# Patient Record
Sex: Female | Born: 1990 | Race: White | Hispanic: No | Marital: Single | Smoking: Current every day smoker
Health system: Southern US, Community
[De-identification: ages and names within clinical notes are randomized; demographics above are authoritative.]

## PROBLEM LIST (undated history)

## (undated) DIAGNOSIS — F419 Anxiety disorder, unspecified: Secondary | ICD-10-CM

## (undated) DIAGNOSIS — R011 Cardiac murmur, unspecified: Secondary | ICD-10-CM

## (undated) DIAGNOSIS — I10 Essential (primary) hypertension: Secondary | ICD-10-CM

## (undated) HISTORY — DX: Anxiety disorder, unspecified: F41.9

## (undated) HISTORY — DX: Cardiac murmur, unspecified: R01.1

---
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
## 2010-04-20 ENCOUNTER — Ambulatory Visit: Payer: Self-pay | Admitting: Internal Medicine

## 2010-05-28 ENCOUNTER — Emergency Department (HOSPITAL_COMMUNITY)
Admission: EM | Admit: 2010-05-28 | Discharge: 2010-05-28 | Payer: Self-pay | Source: Home / Self Care | Admitting: Emergency Medicine

## 2010-06-01 LAB — GC/CHLAMYDIA PROBE AMP, GENITAL
Chlamydia, DNA Probe: POSITIVE — AB
GC Probe Amp, Genital: NEGATIVE

## 2010-06-01 LAB — URINE MICROSCOPIC-ADD ON

## 2010-06-01 LAB — WET PREP, GENITAL: Yeast Wet Prep HPF POC: NONE SEEN

## 2010-06-01 LAB — URINALYSIS, ROUTINE W REFLEX MICROSCOPIC
Protein, ur: NEGATIVE mg/dL
Urobilinogen, UA: 0.2 mg/dL (ref 0.0–1.0)
pH: 6.5 (ref 5.0–8.0)

## 2010-06-17 ENCOUNTER — Emergency Department (HOSPITAL_COMMUNITY)
Admission: EM | Admit: 2010-06-17 | Discharge: 2010-06-17 | Disposition: A | Discharge status: Home / Self Care | Payer: No Typology Code available for payment source | Attending: Emergency Medicine | Admitting: Emergency Medicine

## 2010-06-17 DIAGNOSIS — S1093XA Contusion of unspecified part of neck, initial encounter: Secondary | ICD-10-CM | POA: Insufficient documentation

## 2010-06-17 DIAGNOSIS — S0003XA Contusion of scalp, initial encounter: Secondary | ICD-10-CM | POA: Insufficient documentation

## 2010-06-17 DIAGNOSIS — Y929 Unspecified place or not applicable: Secondary | ICD-10-CM | POA: Insufficient documentation

## 2010-06-17 DIAGNOSIS — S0990XA Unspecified injury of head, initial encounter: Secondary | ICD-10-CM | POA: Insufficient documentation

## 2010-06-17 DIAGNOSIS — F341 Dysthymic disorder: Secondary | ICD-10-CM | POA: Insufficient documentation

## 2010-06-17 DIAGNOSIS — I10 Essential (primary) hypertension: Secondary | ICD-10-CM | POA: Insufficient documentation

## 2010-06-17 DIAGNOSIS — Z79899 Other long term (current) drug therapy: Secondary | ICD-10-CM | POA: Insufficient documentation

## 2010-06-17 DIAGNOSIS — R51 Headache: Secondary | ICD-10-CM | POA: Insufficient documentation

## 2010-08-13 LAB — CBC
HCT: 36.1 % (ref 36.0–46.0)
MCHC: 35.5 g/dL (ref 30.0–36.0)
MCV: 87.6 fL (ref 78.0–100.0)

## 2010-08-13 LAB — URINE MICROSCOPIC-ADD ON

## 2010-08-13 LAB — DIFFERENTIAL
Basophils Relative: 0 % (ref 0–1)
Eosinophils Relative: 1 % (ref 0–5)
Monocytes Absolute: 0.7 10*3/uL (ref 0.1–1.0)
Monocytes Relative: 7 % (ref 3–12)

## 2010-08-13 LAB — COMPREHENSIVE METABOLIC PANEL
ALT: 12 U/L (ref 0–35)
Albumin: 4.3 g/dL (ref 3.5–5.2)
CO2: 23 mEq/L (ref 19–32)
Calcium: 8.9 mg/dL (ref 8.4–10.5)
Creatinine, Ser: 0.48 mg/dL (ref 0.4–1.2)
Glucose, Bld: 101 mg/dL — ABNORMAL HIGH (ref 70–99)
Sodium: 138 mEq/L (ref 135–145)
Total Protein: 7.3 g/dL (ref 6.0–8.3)

## 2010-08-13 LAB — URINALYSIS, ROUTINE W REFLEX MICROSCOPIC
Glucose, UA: NEGATIVE mg/dL
Leukocytes, UA: NEGATIVE
Nitrite: NEGATIVE
Specific Gravity, Urine: >1.03 — ABNORMAL HIGH (ref 1.005–1.030)

## 2010-08-13 LAB — PREGNANCY, URINE: Preg Test, Ur: NEGATIVE

## 2010-08-24 LAB — BASIC METABOLIC PANEL
CO2: 23 mEq/L (ref 19–32)
Calcium: 9.6 mg/dL (ref 8.4–10.5)
Chloride: 106 mEq/L (ref 96–112)
Creatinine, Ser: 0.51 mg/dL (ref 0.4–1.2)
Glucose, Bld: 101 mg/dL — ABNORMAL HIGH (ref 70–99)
Sodium: 138 mEq/L (ref 135–145)

## 2010-08-24 LAB — DIFFERENTIAL
Basophils Absolute: 0.1 10*3/uL (ref 0.0–0.1)
Basophils Relative: 1 % (ref 0–1)
Eosinophils Absolute: 0.2 10*3/uL (ref 0.0–1.2)
Eosinophils Relative: 2 % (ref 0–5)
Monocytes Absolute: 0.6 10*3/uL (ref 0.2–1.2)
Neutro Abs: 4.8 10*3/uL (ref 1.7–8.0)

## 2010-08-24 LAB — CBC
Hemoglobin: 14.3 g/dL (ref 12.0–16.0)
MCHC: 33.9 g/dL (ref 31.0–37.0)
MCV: 87.8 fL (ref 78.0–98.0)
RDW: 12.7 % (ref 11.4–15.5)

## 2010-12-02 ENCOUNTER — Other Ambulatory Visit (HOSPITAL_COMMUNITY): Payer: Self-pay | Admitting: "Endocrinology

## 2010-12-02 DIAGNOSIS — E0590 Thyrotoxicosis, unspecified without thyrotoxic crisis or storm: Secondary | ICD-10-CM

## 2010-12-03 ENCOUNTER — Encounter (HOSPITAL_COMMUNITY)
Admission: RE | Admit: 2010-12-03 | Discharge: 2010-12-03 | Disposition: A | Discharge status: Home / Self Care | Payer: BC Managed Care – PPO | Source: Ambulatory Visit | Attending: "Endocrinology | Admitting: "Endocrinology

## 2010-12-03 ENCOUNTER — Encounter (HOSPITAL_COMMUNITY): Payer: Self-pay

## 2010-12-03 DIAGNOSIS — E0590 Thyrotoxicosis, unspecified without thyrotoxic crisis or storm: Secondary | ICD-10-CM

## 2010-12-03 HISTORY — DX: Essential (primary) hypertension: I10

## 2010-12-03 MED ORDER — SODIUM IODIDE I 131 CAPSULE
10.0000 | Freq: Once | INTRAVENOUS | Status: AC | PRN
  Administered 2010-12-03: 12 via ORAL

## 2010-12-04 ENCOUNTER — Encounter (HOSPITAL_COMMUNITY)
Admission: RE | Admit: 2010-12-04 | Discharge: 2010-12-04 | Disposition: A | Discharge status: Home / Self Care | Payer: BC Managed Care – PPO | Source: Ambulatory Visit | Attending: "Endocrinology | Admitting: "Endocrinology

## 2010-12-04 DIAGNOSIS — E0590 Thyrotoxicosis, unspecified without thyrotoxic crisis or storm: Secondary | ICD-10-CM | POA: Insufficient documentation

## 2010-12-04 MED ORDER — SODIUM PERTECHNETATE TC 99M INJECTION
10.0000 | Freq: Once | INTRAVENOUS | Status: AC | PRN
  Administered 2010-12-04: 10.4 via INTRAVENOUS

## 2013-08-30 ENCOUNTER — Other Ambulatory Visit (HOSPITAL_COMMUNITY)
Admission: RE | Admit: 2013-08-30 | Discharge: 2013-08-30 | Disposition: A | Discharge status: Home / Self Care | Payer: BC Managed Care – PPO | Source: Ambulatory Visit | Attending: Obstetrics and Gynecology | Admitting: Obstetrics and Gynecology

## 2013-08-30 ENCOUNTER — Ambulatory Visit (INDEPENDENT_AMBULATORY_CARE_PROVIDER_SITE_OTHER): Payer: BC Managed Care – PPO | Admitting: Advanced Practice Midwife

## 2013-08-30 ENCOUNTER — Encounter: Payer: Self-pay | Admitting: Advanced Practice Midwife

## 2013-08-30 VITALS — BP 120/84 | Ht 64.5 in | Wt 132.0 lb

## 2013-08-30 DIAGNOSIS — F17200 Nicotine dependence, unspecified, uncomplicated: Secondary | ICD-10-CM

## 2013-08-30 DIAGNOSIS — Z01419 Encounter for gynecological examination (general) (routine) without abnormal findings: Secondary | ICD-10-CM

## 2013-08-30 NOTE — Progress Notes (Signed)
Lorraine MayotteKristen E Allen 22 y.o.  Filed Vitals:   08/30/13 1415  BP: 120/84     Past Medical History: Past Medical History  Diagnosis Date  . Hypertension   . Mitral click-murmur syndrome   . Anxiety     Past Surgical History: History reviewed. No pertinent past surgical history.  Family History: Family History  Problem Relation Age of Onset  . Hypertension Father   . Stroke Father   . Hypertension Maternal Grandfather   . Diabetes Paternal Grandmother   . Thyroid disease Paternal Grandmother   . Stroke Paternal Grandfather   . Cancer Other     maternal great grandma  . Stroke Other     paternal great grandpa    Social History: History  Substance Use Topics  . Smoking status: Current Every Day Smoker -- 1.00 packs/day    Types: Cigarettes  . Smokeless tobacco: Never Used  . Alcohol Use: Yes     Comment: occ    Allergies: No Known Allergies   History of Present Illness:  Here for well woman exam.  Still smoking ~ 1ppd cigarettes.  Smoking cessation: cold [place], patches, gum, chantix, vaping discussed.  Pt will think about it.   Current outpatient prescriptions:ALPRAZolam (XANAX) 0.5 MG tablet, , Disp: , Rfl: ;  atenolol (TENORMIN) 25 MG tablet, Take by mouth daily., Disp: , Rfl:     Review of Systems   Patient denies any headaches, blurred vision, shortness of breath, chest pain, abdominal pain, problems with bowel movements, urination, or intercourse.   Physical Exam: General:  Well developed, well nourished, no acute distress Skin:  Warm and dry Neck:  Midline trachea, normal thyroid Lungs; Clear to auscultation bilaterally Breast:  No dominant palpable mass, retraction, or nipple discharge Cardiovascular: Regular rate and rhythm Abdomen:  Soft, non tender, no hepatosplenomegaly Pelvic:  External genitalia is normal in appearance.  The vagina is normal in appearance.  The cervix is bulbous.  Uterus is felt to be normal size, shape, and contour.  No adnexal  masses or tenderness noted. Extremities:  No swelling or varicosities noted Psych:  No mood changes.  Takes xanax prn long car rides.    Impression: normal GYN exam smoker  Plan: pap q 2-3 years May try e cig Guardisil

## 2013-09-05 ENCOUNTER — Encounter: Payer: Self-pay | Admitting: Advanced Practice Midwife

## 2013-09-17 ENCOUNTER — Other Ambulatory Visit: Payer: BC Managed Care – PPO

## 2013-09-17 DIAGNOSIS — Z113 Encounter for screening for infections with a predominantly sexual mode of transmission: Secondary | ICD-10-CM

## 2013-09-18 LAB — GC/CHLAMYDIA PROBE AMP
CT PROBE, AMP APTIMA: NEGATIVE
GC Probe RNA: NEGATIVE

## 2013-09-19 ENCOUNTER — Encounter: Payer: Self-pay | Admitting: Advanced Practice Midwife

## 2014-02-02 ENCOUNTER — Emergency Department (INDEPENDENT_AMBULATORY_CARE_PROVIDER_SITE_OTHER)
Admission: EM | Admit: 2014-02-02 | Discharge: 2014-02-02 | Disposition: A | Discharge status: Home / Self Care | Payer: BC Managed Care – PPO | Source: Home / Self Care | Attending: Emergency Medicine | Admitting: Emergency Medicine

## 2014-02-02 ENCOUNTER — Encounter (HOSPITAL_COMMUNITY): Payer: Self-pay | Admitting: Emergency Medicine

## 2014-02-02 DIAGNOSIS — M436 Torticollis: Secondary | ICD-10-CM

## 2014-02-02 MED ORDER — CYCLOBENZAPRINE HCL 5 MG PO TABS: 5.0000 mg | ORAL_TABLET | Freq: Three times a day (TID) | ORAL | Status: DC | PRN

## 2014-02-02 MED ORDER — HYDROCODONE-ACETAMINOPHEN 5-325 MG PO TABS: ORAL_TABLET | ORAL | Status: DC

## 2014-02-02 MED ORDER — DICLOFENAC SODIUM 75 MG PO TBEC: 75.0000 mg | DELAYED_RELEASE_TABLET | Freq: Two times a day (BID) | ORAL | Status: DC

## 2014-02-02 NOTE — ED Provider Notes (Signed)
Chief Complaint   Torticollis   History of Present Illness   Lorraine Allen is a 23 year old female who awoke 3 days ago with left-sided neck pain without radiation. This extended from the base of the skull into the trapezius ridge and towards the shoulder. It was worse with any movement of the neck especially lateral bending or twisting to either side. She is able to flex and extend pretty well. There is no radiation the pain down the arm, numbness, tingling, or weakness. No swelling of the arm. She denies any fever, chills, or headache. There no masses in the neck. She denies chest pain or shortness of breath. There's been no injury to her neck.  Review of Systems   Other than noted above, the patient denies any of the following symptoms: Constitutional:  No fever or chills. Neck:  No swelling, or adenopathy.   Cardiac:  No chest pain, tightness, or pressure. Respiratory:  No cough or dyspnea. M-S:  No joint pain, muscle pain, or back problems. Neuro:  No headache, muscle weakness, or paresthesias.  PMFSH   Past medical history, family history, social history, meds, and allergies were reviewed.  She takes [employer]. She has a history of mitral prolapse and hypertension.  Physical Examination    Vital signs:  BP 129/86  Pulse 87  Temp(Src) 98.1 F (36.7 C) (Oral)  Resp 16  SpO2 100% General:  Alert, oriented and in no distress. ENT:  Pharynx clear, no oral lesions. Neck:  There is pain to palpation over the left trapezius ridge, her neck has a limited range of motionwith lateral bending and rotation to either side. She can flex well, but there is some limitation of extension.  Spurling's test was negative. Lungs:  No respiratory distress.  Breath sounds clear and equal bilaterally.  No wheezes, rales or rhonchi. Heart:  Regular rhythm.  No gallops, murmers, or rubs. Ext:  No upper extremity edema, pulses full.  Full ROM of joints with no joint or muscle pain to  palpation. Neuro:  Alert and oriented times 3.  No focal muscle weakness.  DTRs symmetric.  Sensation intact to light touch. Skin: Clear, warm and dry.  No rash.  Good capillary refill.  Assessment   The encounter diagnosis was Torticollis.  No evidence of cervical radiculopathy.  Plan    1.  Meds:  The following meds were prescribed:   Discharge Medication List as of 02/02/2014  4:18 PM    START taking these medications   Details  cyclobenzaprine (FLEXERIL) 5 MG tablet Take 1 tablet (5 mg total) by mouth 3 (three) times daily as needed for muscle spasms., Starting 02/02/2014, Until Discontinued, Normal    diclofenac (VOLTAREN) 75 MG EC tablet Take 1 tablet (75 mg total) by mouth 2 (two) times daily., Starting 02/02/2014, Until Discontinued, Normal    HYDROcodone-acetaminophen (NORCO/VICODIN) 5-325 MG per tablet 1 to 2 tabs every 4 to 6 hours as needed for pain., Print        2.  Patient Education/Counseling:  The patient was given appropriate handouts, self care instructions, and instructed in pain control.  Given exercises to do twice daily followed by moist heat.    3.  Follow up:  The patient was told to follow up here if no better in 3 to 4 days, or sooner if becoming worse in any way, and given some red flag symptoms such as worsening pain or new neurological symptoms which would prompt immediate return.  Reuben Likes, MD 02/02/14 [phone]

## 2014-02-02 NOTE — ED Notes (Signed)
Patient woke up a couple days ago with a stiff neck Denies any injury

## 2014-02-02 NOTE — Discharge Instructions (Signed)
TREATMENT  °Treatment initially involves the use of ice and medication to help reduce pain and inflammation. It is also important to perform strengthening and stretching exercises and modify activities that worsen symptoms so the injury does not get worse. These exercises may be performed at home or with a therapist. For patients who experience severe symptoms, a soft padded collar may be recommended to be worn around the neck.  °Improving your posture may help reduce symptoms. Posture improvement includes pulling your chin and abdomen in while sitting or standing. If you are sitting, sit in a firm chair with your buttocks against the back of the chair. While sleeping, try replacing your pillow with a small towel rolled to 2 inches in diameter, or use a cervical pillow. Poor sleeping positions delay healing.  ° °MEDICATION  °· If pain medication is necessary, nonsteroidal anti-inflammatory medications, such as aspirin and ibuprofen, or other minor pain relievers, such as acetaminophen, are often recommended. °· Do not take pain medication for 7 days before surgery. °· Prescription pain relievers may be given if deemed necessary by your caregiver. Use only as directed and only as much as you need. ° °HEAT AND COLD:  °· Cold treatment (icing) relieves pain and reduces inflammation. Cold treatment should be applied for 10 to 15 minutes every 2 to 3 hours for inflammation and pain and immediately after any activity that aggravates your symptoms. Use ice packs or an ice massage. °· Heat treatment may be used prior to performing the stretching and strengthening activities prescribed by your caregiver, physical therapist, or athletic trainer. Use a heat pack or a warm soak. ° °SEEK MEDICAL CARE IF:  °· Symptoms get worse or do not improve in 2 weeks despite treatment. °· New, unexplained symptoms develop (drugs used in treatment may produce side effects). ° °EXERCISES °RANGE OF MOTION (ROM) AND STRETCHING EXERCISES -  Cervical Strain and Sprain °These exercises may help you when beginning to rehabilitate your injury. In order to successfully resolve your symptoms, you must improve your posture. These exercises are designed to help reduce the forward-head and rounded-shoulder posture which contributes to this condition. Your symptoms may resolve with or without further involvement from your physician, physical therapist or athletic trainer. While completing these exercises, remember:  °· Restoring tissue flexibility helps normal motion to return to the joints. This allows healthier, less painful movement and activity. °· An effective stretch should be held for at least 20 seconds, although you may need to begin with shorter hold times for comfort. °· A stretch should never be painful. You should only feel a gentle lengthening or release in the stretched tissue. ° °STRETCH- Axial Extensors °· Lie on your back on the floor. You may bend your knees for comfort. Place a rolled up hand towel or dish towel, about 2 inches in diameter, under the part of your head that makes contact with the floor. °· Gently tuck your chin, as if trying to make a "double chin," until you feel a gentle stretch at the base of your head. °· Hold _____10_____ seconds. °Repeat _____10_____ times. Complete this exercise _____2_____ times per day.  ° °STRETECH - Axial Extension  °· Stand or sit on a firm surface. Assume a good posture: chest up, shoulders drawn back, abdominal muscles slightly tense, knees unlocked (if standing) and feet hip width apart. °· Slowly retract your chin so your head slides back and your chin slightly lowers.Continue to look straight ahead. °· You should feel a gentle stretch   in the back of your head. Be certain not to feel an aggressive stretch since this can cause headaches later. °· Hold for ____10______ seconds. °Repeat _____10_____ times. Complete this exercise ____2______ times per day. ° °STRETCH  Cervical Side Bend  °· Stand  or sit on a firm surface. Assume a good posture: chest up, shoulders drawn back, abdominal muscles slightly tense, knees unlocked (if standing) and feet hip width apart. °· Without letting your nose or shoulders move, slowly tip your right / left ear to your shoulder until your feel a gentle stretch in the muscles on the opposite side of your neck. °· Hold _____10_____ seconds. °Repeat _____10_____ times. Complete this exercise _____2_____ times per day. ° °STRETCH  Cervical Rotators  °· Stand or sit on a firm surface. Assume a good posture: chest up, shoulders drawn back, abdominal muscles slightly tense, knees unlocked (if standing) and feet hip width apart. °· Keeping your eyes level with the ground, slowly turn your head until you feel a gentle stretch along the back and opposite side of your neck. °· Hold _____10_____ seconds. °Repeat ____10______ times. Complete this exercise ____2______ times per day. ° °RANGE OF MOTION - Neck Circles  °· Stand or sit on a firm surface. Assume a good posture: chest up, shoulders drawn back, abdominal muscles slightly tense, knees unlocked (if standing) and feet hip width apart. °· Gently roll your head down and around from the back of one shoulder to the back of the other. The motion should never be forced or painful. °· Repeat the motion 10-20 times, or until you feel the neck muscles relax and loosen. °Repeat ____10______ times. Complete the exercise _____2_____ times per day. ° °STRENGTHENING EXERCISES - Cervical Strain and Sprain °These exercises may help you when beginning to rehabilitate your injury. They may resolve your symptoms with or without further involvement from your physician, physical therapist or athletic trainer. While completing these exercises, remember:  °· Muscles can gain both the endurance and the strength needed for everyday activities through controlled exercises. °· Complete these exercises as instructed by your physician, physical therapist or  athletic trainer. Progress the resistance and repetitions only as guided. °· You may experience muscle soreness or fatigue, but the pain or discomfort you are trying to eliminate should never worsen during these exercises. If this pain does worsen, stop and make certain you are following the directions exactly. If the pain is still present after adjustments, discontinue the exercise until you can discuss the trouble with your clinician. ° °STRENGTH Cervical Flexors, Isometric °· Face a wall, standing about 6 inches away. Place a small pillow, a ball about 6-8 inches in diameter, or a folded towel between your forehead and the wall. °· Slightly tuck your chin and gently push your forehead into the soft object. Push only with mild to moderate intensity, building up tension gradually. Keep your jaw and forehead relaxed. °· Hold 10 to 20 seconds. Keep your breathing relaxed. °· Release the tension slowly. Relax your neck muscles completely before you start the next repetition. °Repeat _____10_____ times. Complete this exercise _____2_____ times per day. ° °STRENGTH- Cervical Lateral Flexors, Isometric  °· Stand about 6 inches away from a wall. Place a small pillow, a ball about 6-8 inches in diameter, or a folded towel between the side of your head and the wall. °· Slightly tuck your chin and gently tilt your head into the soft object. Push only with mild to moderate intensity, building up tension gradually. Keep   your jaw and forehead relaxed. °· Hold 10 to 20 seconds. Keep your breathing relaxed. °· Release the tension slowly. Relax your neck muscles completely before you start the next repetition. °Repeat _____10_____ times. Complete this exercise ____2______ times per day. ° °STRENGTH  Cervical Extensors, Isometric  °· Stand about 6 inches away from a wall. Place a small pillow, a ball about 6-8 inches in diameter, or a folded towel between the back of your head and the wall. °· Slightly tuck your chin and gently  tilt your head back into the soft object. Push only with mild to moderate intensity, building up tension gradually. Keep your jaw and forehead relaxed. °· Hold 10 to 20 seconds. Keep your breathing relaxed. °· Release the tension slowly. Relax your neck muscles completely before you start the next repetition. °Repeat _____10_____ times. Complete this exercise _____2_____ times per day. ° °POSTURE AND BODY MECHANICS CONSIDERATIONS - Cervical Strain and Sprain °Keeping correct posture when sitting, standing or completing your activities will reduce the stress put on different body tissues, allowing injured tissues a chance to heal and limiting painful experiences. The following are general guidelines for improved posture. Your physician or physical therapist will provide you with any instructions specific to your needs. While reading these guidelines, remember: °· The exercises prescribed by your provider will help you have the flexibility and strength to maintain correct postures. °· The correct posture provides the optimal environment for your joints to work. All of your joints have less wear and tear when properly supported by a spine with good posture. This means you will experience a healthier, less painful body. °· Correct posture must be practiced with all of your activities, especially prolonged sitting and standing. Correct posture is as important when doing repetitive low-stress activities (typing) as it is when doing a single heavy-load activity (lifting). °PROLONGED STANDING WHILE SLIGHTLY LEANING FORWARD °When completing a task that requires you to lean forward while standing in one place for a long time, place either foot up on a stationary 2-4 inch high object to help maintain the best posture. When both feet are on the ground, the low back tends to lose its slight inward curve. If this curve flattens (or becomes too large), then the back and your other joints will experience too much stress, fatigue  more quickly and can cause pain.  °RESTING POSITIONS °Consider which positions are most painful for you when choosing a resting position. If you have pain with flexion-based activities (sitting, bending, stooping, squatting), choose a position that allows you to rest in a less flexed posture. You would want to avoid curling into a fetal position on your side. If your pain worsens with extension-based activities (prolonged standing, working overhead), avoid resting in an extended position such as sleeping on your stomach. Most people will find more comfort when they rest with their spine in a more neutral position, neither too rounded nor too arched. Lying on a non-sagging bed on your side with a pillow between your knees, or on your back with a pillow under your knees will often provide some relief. Keep in mind, being in any one position for a prolonged period of time, no matter how correct your posture, can still lead to stiffness. °WALKING °Walk with an upright posture. Your ears, shoulders and hips should all line-up. °OFFICE WORK °When working at a desk, create an environment that supports good, upright posture. Without extra support, muscles fatigue and lead to excessive strain on joints and other tissues. °  CHAIR: °· A chair should be able to slide under your desk when your back makes contact with the back of the chair. This allows you to work closely. °· The chair's height should allow your eyes to be level with the upper part of your monitor and your hands to be slightly lower than your elbows. °· Body position: °· Your feet should make contact with the floor. If this is not possible, use a foot rest. °· Keep your ears over your shoulders. This will reduce stress on your neck and low back. °Document Released: 04/26/2005 Document Revised: 07/19/2011 Document Reviewed: 08/08/2008 °[employer]® Patient Information ©2013 [employer]. ° ° °

## 2014-03-04 ENCOUNTER — Telehealth: Payer: Self-pay | Admitting: *Deleted

## 2014-03-04 NOTE — Telephone Encounter (Signed)
Pt states she has been bleeding X 2 months, started as a regular period but has not stopped.  Bleeding will lighten up some then get heavy again, she states she is not on any birth control and is concerned as to why she has been bleeding for so long.  Advised pt need to be seen to assess, pt agreed and call was transferred to front staff for appointment to be made.

## 2014-03-06 ENCOUNTER — Ambulatory Visit (INDEPENDENT_AMBULATORY_CARE_PROVIDER_SITE_OTHER): Payer: BC Managed Care – PPO | Admitting: Advanced Practice Midwife

## 2014-03-06 ENCOUNTER — Encounter: Payer: Self-pay | Admitting: Advanced Practice Midwife

## 2014-03-06 ENCOUNTER — Ambulatory Visit: Payer: BC Managed Care – PPO | Admitting: Advanced Practice Midwife

## 2014-03-06 VITALS — BP 134/88 | Ht 65.0 in | Wt 127.0 lb

## 2014-03-06 DIAGNOSIS — Z113 Encounter for screening for infections with a predominantly sexual mode of transmission: Secondary | ICD-10-CM

## 2014-03-06 DIAGNOSIS — N938 Other specified abnormal uterine and vaginal bleeding: Secondary | ICD-10-CM

## 2014-03-06 DIAGNOSIS — Z3202 Encounter for pregnancy test, result negative: Secondary | ICD-10-CM

## 2014-03-06 LAB — POCT URINE PREGNANCY: PREG TEST UR: NEGATIVE

## 2014-03-06 MED ORDER — MEGESTROL ACETATE 40 MG PO TABS
ORAL_TABLET | ORAL | Status: DC
Start: 2014-03-06 — End: 2014-07-24

## 2014-03-06 NOTE — Progress Notes (Signed)
[facility] Visit  Patient name: Lorraine Allen Stafford MRN [id number]  Date of birth: 1991-04-29  CC & HPI:  Lorraine Allen Akram is a 23 y.o. Caucasian female presenting today for intramenstrual bleeding.  She had a normal period 9/1 for about a week, spotted q day since with sometimes heavier.  Feels like she is now on her regular period. Wants Nexplanon  Pertinent History Reviewed:  Medical & Surgical Hx:   Past Medical History  Diagnosis Date  . Hypertension   . Mitral click-murmur syndrome   . Anxiety    History reviewed. No pertinent past surgical history. Medications: Reviewed & Updated - see associated section Social History: Reviewed -  reports that she has been smoking Cigarettes.  She has been smoking about 0.50 packs per day. She has never used smokeless tobacco.  Objective Findings:  Vitals: BP 134/88  Ht 5\' 5"  (1.651 m)  Wt 127 lb (57.607 kg)  BMI 21.13 kg/m2  LMP 01/08/2014  Physical Examination: General appearance - alert, well appearing, and in no distress Mental status - alert, oriented to person, place, and time Pelvic - SSE;  Had tampon in, dark menstrual blood coming from cx os.  Cx non friable Wet prep negative  Results for orders placed in visit on 03/06/14 (from the past 24 hour(s))  POCT URINE PREGNANCY   Collection Time    03/06/14  1:36 PM      Result Value Ref Range   Preg Test, Ur Negative      HGB 13.0  Assessment & Plan:  A:   DUB, ? origin P:  Plans  Nexplanon-no sex until then.  Megace algorhythm, GC/CHL  F/U 3 weeks CRESENZO-DISHMAN,Tippi Mccrae CNM 03/06/2014 2:11 PM

## 2014-03-07 LAB — GC/CHLAMYDIA PROBE AMP
CT PROBE, AMP APTIMA: NEGATIVE
GC Probe RNA: NEGATIVE

## 2014-03-27 ENCOUNTER — Ambulatory Visit (INDEPENDENT_AMBULATORY_CARE_PROVIDER_SITE_OTHER): Payer: BC Managed Care – PPO | Admitting: Advanced Practice Midwife

## 2014-03-27 ENCOUNTER — Encounter: Payer: Self-pay | Admitting: Advanced Practice Midwife

## 2014-03-27 VITALS — BP 128/82 | Ht 65.0 in | Wt 130.0 lb

## 2014-03-27 DIAGNOSIS — Z3202 Encounter for pregnancy test, result negative: Secondary | ICD-10-CM

## 2014-03-27 DIAGNOSIS — Z30017 Encounter for initial prescription of implantable subdermal contraceptive: Secondary | ICD-10-CM | POA: Insufficient documentation

## 2014-03-27 DIAGNOSIS — Z3049 Encounter for surveillance of other contraceptives: Secondary | ICD-10-CM

## 2014-03-27 LAB — POCT URINE PREGNANCY: Preg Test, Ur: NEGATIVE

## 2014-03-27 MED ORDER — AZITHROMYCIN 250 MG PO TABS: 250.0000 mg | ORAL_TABLET | Freq: Every day | ORAL | Status: DC

## 2014-03-27 NOTE — Progress Notes (Signed)
  HPI:  Lorraine Allen is a 23 y.o. year old Caucasian female here for Nexplanon insertion.  Her LMP was 03/06/14, she has been abstinate this month and on megace for DUB, and her pregnancy test today was negative.  Risks/benefits/side effects of Nexplanon have been discussed and her questions have been answered.  Specifically, a failure rate of 05/998 has been reported, with an increased failure rate if pt takes St. John's Wort and/or antiseizure medicaitons.  Lorraine Allen is aware of the common side effect of irregular bleeding, which the incidence of decreases over time.   Past Medical History: Past Medical History  Diagnosis Date  . Hypertension   . Mitral click-murmur syndrome   . Anxiety     Past Surgical History: History reviewed. No pertinent past surgical history.  Family History: Family History  Problem Relation Age of Onset  . Hypertension Father   . Stroke Father   . Hypertension Maternal Grandfather   . Diabetes Paternal Grandmother   . Thyroid disease Paternal Grandmother   . Stroke Paternal Grandfather   . Cancer Other     maternal great grandma  . Stroke Other     paternal great grandpa    Social History: History  Substance Use Topics  . Smoking status: Current Every Day Smoker -- 0.50 packs/day    Types: Cigarettes  . Smokeless tobacco: Never Used  . Alcohol Use: Yes     Comment: occx1 mos    Allergies:  Allergies  Allergen Reactions  . Strawberry Swelling      Her right arm, approximatly 4 inches proximal from the elbow, was cleansed with alcohol and anesthetized with 2cc of 2% Lidocaine.  The area was cleansed again and the Nexplanon was inserted without difficulty.  A pressure bandage was applied.  Pt was instructed to remove pressure bandage in a few hours, and keep insertion site covered with a bandaid for 3 days.  Back up contraception was recommended for 3 weeks.  Follow-up scheduled PRN problems  zpack sent to pharmacy for URI sx > 1  week  CRESENZO-DISHMAN,Darryll Raju 03/27/2014 10:10 AM

## 2014-05-23 ENCOUNTER — Emergency Department (HOSPITAL_COMMUNITY): Payer: BLUE CROSS/BLUE SHIELD

## 2014-05-23 ENCOUNTER — Emergency Department (HOSPITAL_COMMUNITY)
Admission: EM | Admit: 2014-05-23 | Discharge: 2014-05-23 | Disposition: A | Discharge status: Home / Self Care | Payer: BLUE CROSS/BLUE SHIELD | Attending: Emergency Medicine | Admitting: Emergency Medicine

## 2014-05-23 ENCOUNTER — Encounter (HOSPITAL_COMMUNITY): Payer: Self-pay | Admitting: Emergency Medicine

## 2014-05-23 DIAGNOSIS — R200 Anesthesia of skin: Secondary | ICD-10-CM | POA: Insufficient documentation

## 2014-05-23 DIAGNOSIS — R51 Headache: Secondary | ICD-10-CM | POA: Diagnosis not present

## 2014-05-23 DIAGNOSIS — I10 Essential (primary) hypertension: Secondary | ICD-10-CM | POA: Diagnosis not present

## 2014-05-23 DIAGNOSIS — Z720 Tobacco use: Secondary | ICD-10-CM | POA: Insufficient documentation

## 2014-05-23 DIAGNOSIS — Z793 Long term (current) use of hormonal contraceptives: Secondary | ICD-10-CM | POA: Insufficient documentation

## 2014-05-23 DIAGNOSIS — F419 Anxiety disorder, unspecified: Secondary | ICD-10-CM | POA: Diagnosis not present

## 2014-05-23 DIAGNOSIS — Z79899 Other long term (current) drug therapy: Secondary | ICD-10-CM | POA: Insufficient documentation

## 2014-05-23 DIAGNOSIS — Z7952 Long term (current) use of systemic steroids: Secondary | ICD-10-CM | POA: Insufficient documentation

## 2014-05-23 LAB — CBC WITH DIFFERENTIAL/PLATELET
Basophils Absolute: 0 10*3/uL (ref 0.0–0.1)
Basophils Relative: 0 % (ref 0–1)
EOS ABS: 0.2 10*3/uL (ref 0.0–0.7)
Eosinophils Relative: 2 % (ref 0–5)
HCT: 45 % (ref 36.0–46.0)
HEMOGLOBIN: 15 g/dL (ref 12.0–15.0)
LYMPHS ABS: 2 10*3/uL (ref 0.7–4.0)
LYMPHS PCT: 16 % (ref 12–46)
MCH: 31.6 pg (ref 26.0–34.0)
MCHC: 33.3 g/dL (ref 30.0–36.0)
MCV: 94.9 fL (ref 78.0–100.0)
Monocytes Absolute: 1 10*3/uL (ref 0.1–1.0)
Monocytes Relative: 8 % (ref 3–12)
Neutro Abs: 9 10*3/uL — ABNORMAL HIGH (ref 1.7–7.7)
Neutrophils Relative %: 74 % (ref 43–77)
PLATELETS: 309 10*3/uL (ref 150–400)
RBC: 4.74 MIL/uL (ref 3.87–5.11)
RDW: 12.5 % (ref 11.5–15.5)
WBC: 12.2 10*3/uL — ABNORMAL HIGH (ref 4.0–10.5)

## 2014-05-23 LAB — BASIC METABOLIC PANEL
Anion gap: 7 (ref 5–15)
BUN: 13 mg/dL (ref 6–23)
CALCIUM: 9.7 mg/dL (ref 8.4–10.5)
CO2: 27 mmol/L (ref 19–32)
Chloride: 104 mEq/L (ref 96–112)
Creatinine, Ser: 0.57 mg/dL (ref 0.50–1.10)
GFR calc non Af Amer: >90 mL/min (ref >90)
Glucose, Bld: 127 mg/dL — ABNORMAL HIGH (ref 70–99)
POTASSIUM: 3.2 mmol/L — AB (ref 3.5–5.1)
Sodium: 138 mmol/L (ref 135–145)

## 2014-05-23 MED ORDER — PREDNISONE 50 MG PO TABS
60.0000 mg | ORAL_TABLET | Freq: Once | ORAL | Status: AC
  Administered 2014-05-23: 60 mg via ORAL
  Filled 2014-05-23 (×2): qty 1

## 2014-05-23 MED ORDER — ACYCLOVIR 400 MG PO TABS: 400.0000 mg | ORAL_TABLET | Freq: Every day | ORAL | Status: DC

## 2014-05-23 MED ORDER — ACYCLOVIR 800 MG PO TABS
800.0000 mg | ORAL_TABLET | Freq: Once | ORAL | Status: AC
  Administered 2014-05-23: 800 mg via ORAL
  Filled 2014-05-23: qty 1

## 2014-05-23 MED ORDER — METOCLOPRAMIDE HCL 5 MG/ML IJ SOLN
10.0000 mg | Freq: Once | INTRAMUSCULAR | Status: AC
  Administered 2014-05-23: 10 mg via INTRAVENOUS
  Filled 2014-05-23: qty 2

## 2014-05-23 MED ORDER — PREDNISONE 20 MG PO TABS: ORAL_TABLET | ORAL | Status: DC

## 2014-05-23 MED ORDER — KETOROLAC TROMETHAMINE 30 MG/ML IJ SOLN
30.0000 mg | Freq: Once | INTRAMUSCULAR | Status: AC
  Administered 2014-05-23: 30 mg via INTRAVENOUS
  Filled 2014-05-23: qty 1

## 2014-05-23 MED ORDER — DIPHENHYDRAMINE HCL 50 MG/ML IJ SOLN
25.0000 mg | Freq: Once | INTRAMUSCULAR | Status: AC
  Administered 2014-05-23: 25 mg via INTRAVENOUS
  Filled 2014-05-23: qty 1

## 2014-05-23 NOTE — ED Provider Notes (Signed)
CSN: [id number]     Arrival date & time 05/23/14  1651 History  This chart was scribed for Lorraine Allen, * by Richarda Overlie, ED Scribe. This patient was seen in room APA11/APA11 and the patient's care was started 5:08 PM.    Chief Complaint  Patient presents with  . Numbness   The history is provided by the patient. No language interpreter was used.   HPI Comments: Lorraine Allen is a 24 y.o. female with a history of HTN, mitral click-murmur syndrome and anxiety who presents to the Emergency Department complaining of left sided facial numbness that started this morning when she woke up. Pt states she can feel a difference in sensation from the left side of her forehead down to her mouth in comparison to the right side of her face. Pt reports associated HA and rates her pain as a 2/10 at this time. She states she checked her blood pressure earlier today and her systolic pressure was 180. She states she topped taking her blood pressure medication 1 month ago and has been exercising more recently. She denies any eye irritation, burning or watering.    Past Medical History  Diagnosis Date  . Hypertension   . Mitral click-murmur syndrome   . Anxiety    History reviewed. No pertinent past surgical history. Family History  Problem Relation Age of Onset  . Hypertension Father   . Stroke Father   . Hypertension Maternal Grandfather   . Diabetes Paternal Grandmother   . Thyroid disease Paternal Grandmother   . Stroke Paternal Grandfather   . Cancer Other     maternal great grandma  . Stroke Other     paternal great grandpa   History  Substance Use Topics  . Smoking status: Current Every Day Smoker -- 0.50 packs/day    Types: Cigarettes  . Smokeless tobacco: Never Used  . Alcohol Use: Yes     Comment: occx1 mos   OB History    No data available     Review of Systems  Neurological: Positive for numbness.  All other systems reviewed and are negative.   Allergies   Strawberry  Home Medications   Prior to Admission medications   Medication Sig Start Date End Date Taking? Authorizing Provider  amphetamine-dextroamphetamine (ADDERALL XR) 15 MG 24 hr capsule Take 15 mg by mouth daily. 04/23/14  Yes Historical Provider, MD  etonogestrel (NEXPLANON) 68 MG IMPL implant 1 each by Subdermal route once.   Yes Historical Provider, MD  acyclovir (ZOVIRAX) 400 MG tablet Take 1 tablet (400 mg total) by mouth 5 (five) times daily. 05/23/14   Lorraine Crease, MD  azithromycin (ZITHROMAX) 250 MG tablet Take 1 tablet (250 mg total) by mouth daily. Take 2 today, the 1 a day for 4 days Patient not taking: Reported on 05/23/2014 03/27/14   Jacklyn Shell, CNM  megestrol (MEGACE) 40 MG tablet Take 3/day (at the same time) for 5 days; 2/day for 5 days, then 1/day PO prn bleeding Patient not taking: Reported on 05/23/2014 03/06/14   Jacklyn Shell, CNM  predniSONE (DELTASONE) 20 MG tablet Take 3 tablets a day for 5 days, then take 2.5 tablets for one day, then 2 tablets for one day, then 1.5 tablets for one day, then 1 tablet for one day, then 0.5 tablet for one day, then stop 05/23/14   Lorraine Crease, MD   BP 138/91 mmHg  Pulse 95  Temp(Src) 98.2 F (36.8 C) (Oral)  Resp 18  Ht 5\' 5"  (1.651 m)  Wt 140 lb (63.504 kg)  BMI 23.30 kg/m2  SpO2 99%  LMP 04/22/2014 Physical Exam  Constitutional: She is oriented to person, place, and time. She appears well-developed and well-nourished. No distress.  HENT:  Head: Normocephalic and atraumatic.  Right Ear: Hearing normal.  Left Ear: Hearing normal.  Nose: Nose normal.  Mouth/Throat: Oropharynx is clear and moist and mucous membranes are normal.  Eyes: Conjunctivae and EOM are normal. Pupils are equal, round, and reactive to light.  Neck: Normal range of motion. Neck supple.  Cardiovascular: Regular rhythm, S1 normal and S2 normal.  Exam reveals no gallop and no friction rub.   No murmur  heard. Pulmonary/Chest: Effort normal and breath sounds normal. No respiratory distress. She exhibits no tenderness.  Abdominal: Soft. Normal appearance and bowel sounds are normal. There is no hepatosplenomegaly. There is no tenderness. There is no rebound, no guarding, no tenderness at McBurney's point and negative Murphy's sign. No hernia.  Musculoskeletal: Normal range of motion.  Neurological: She is alert and oriented to person, place, and time. She has normal strength. No cranial nerve deficit or sensory deficit. Coordination normal. GCS eye subscore is 4. GCS verbal subscore is 5. GCS motor subscore is 6.  Slight left nasolabial blunting   Skin: Skin is warm, dry and intact. No rash noted. No cyanosis.  Psychiatric: She has a normal mood and affect. Her speech is normal and behavior is normal. Thought content normal.  Nursing note and vitals reviewed.   ED Course  Procedures  DIAGNOSTIC STUDIES: Oxygen Saturation is 100% on RA, normal by my interpretation.    COORDINATION OF CARE: 5:15 PM Discussed treatment plan with pt at bedside and pt agreed to plan.   Labs Review Labs Reviewed  CBC WITH DIFFERENTIAL - Abnormal; Notable for the following:    WBC 12.2 (*)    Neutro Abs 9.0 (*)    All other components within normal limits  BASIC METABOLIC PANEL - Abnormal; Notable for the following:    Potassium 3.2 (*)    Glucose, Bld 127 (*)    All other components within normal limits    Imaging Review Mr Brain Wo Contrast  05/23/2014   CLINICAL DATA:  Left-sided facial numbness which began earlier today. History of hypertension. History of mitral valvular disease.  EXAM: MRI HEAD WITHOUT CONTRAST  TECHNIQUE: Multiplanar, multiecho pulse sequences of the brain and surrounding structures were obtained without intravenous contrast.  COMPARISON:  CT head 02/08/2009.  FINDINGS: No evidence for acute infarction, hemorrhage, mass lesion, hydrocephalus, or extra-axial fluid. Normal cerebral  volume. No white matter disease. Slight torcular asymmetry, eccentric to the RIGHT, felt to be developmental, not pathologic.  Flow voids are maintained throughout the carotid, basilar, and LEFT vertebral arteries. RIGHT vertebral is small, ending in PICA. There are no areas of chronic hemorrhage.  Pituitary, pineal, and cerebellar tonsils unremarkable. No upper cervical lesions. Visualized calvarium, skull base, and upper cervical osseous structures unremarkable. Scalp and extracranial soft tissues, orbits, sinuses, and mastoids show no acute process.  IMPRESSION: Negative exam. No acute stroke or other lateralizing intracranial abnormality.   Electronically Signed   By: Davonna BellingJohn  Curnes M.D.   On: 05/23/2014 18:25     EKG Interpretation None      MDM   Final diagnoses:  Left facial numbness   She presents to the ER for evaluation of left facial numbness. Symptoms present upon awakening this morning. Examination did reveal very slight nasolabial blunting  and drooping on the left side. These are the only objective findings. Subjectively, she reports numbness in this includes the forehead and upper motor neuron. She does not, however, have any weakness of the upper motor normal.  Ventral diagnosis includes Bell's palsy, acute stroke, migraine headache. Patient does endorse headache. This completely resolved with treatment for migraine. Her symptoms have improved here in the ER as well. She reports that she is back to normal now. This was likely secondary to the headache, but cannot rule out Bell's palsy. Will treat with prednisone and acyclovir.  MRI was performed and is negative. I did discuss the case briefly with Dr. Gerilyn Pilgrim, neurology. He agreed with management. He did not feel there was any further workup or treatment necessary.  I personally performed the services described in this documentation, which was scribed in my presence. The recorded information has been reviewed and is  accurate.       Lorraine Crease, MD 05/23/14 [phone]

## 2014-05-23 NOTE — Discharge Instructions (Signed)
Possible Bell's Palsy Bell's palsy is a condition in which the muscles on one side of the face cannot move (paralysis). This is because the nerves in the face are paralyzed. It is most often thought to be caused by a virus. The virus causes swelling of the nerve that controls movement on one side of the face. The nerve travels through a tight space surrounded by bone. When the nerve swells, it can be compressed by the bone. This results in damage to the protective covering around the nerve. This damage interferes with how the nerve communicates with the muscles of the face. As a result, it can cause weakness or paralysis of the facial muscles.  Injury (trauma), tumor, and surgery may cause Bell's palsy, but most of the time the cause is unknown. It is a relatively common condition. It starts suddenly (abrupt onset) with the paralysis usually ending within 2 days. Bell's palsy is not dangerous. But because the eye does not close properly, you may need care to keep the eye from getting dry. This can include splinting (to keep the eye shut) or moistening with artificial tears. Bell's palsy very seldom occurs on both sides of the face at the same time. SYMPTOMS   Eyebrow sagging.  Drooping of the eyelid and corner of the mouth.  Inability to close one eye.  Loss of taste on the front of the tongue.  Sensitivity to loud noises. TREATMENT  The treatment is usually non-surgical. If the patient is seen within the first 24 to 48 hours, a short course of steroids may be prescribed, in an attempt to shorten the length of the condition. Antiviral medicines may also be used with the steroids, but it is unclear if they are helpful.  You will need to protect your eye, if you cannot close it. The cornea (clear covering over your eye) will become dry and can be damaged. Artificial tears can be used to keep your eye moist. Glasses or an eye patch should be worn to protect your eye. PROGNOSIS  Recovery is variable,  ranging from days to months. Although the problem usually goes away completely (about 80% of cases resolve), predicting the outcome is impossible. Most people improve within 3 weeks of when the symptoms began. Improvement may continue for 3 to 6 months. A small number of people have moderate to severe weakness that is permanent.  HOME CARE INSTRUCTIONS   If your caregiver prescribed medication to reduce swelling in the nerve, use as directed. Do not stop taking the medication unless directed by your caregiver.  Use moisturizing eye drops as needed to prevent drying of your eye, as directed by your caregiver.  Protect your eye, as directed by your caregiver.  Use facial massage and exercises, as directed by your caregiver.  Perform your normal activities, and get your normal rest. SEEK IMMEDIATE MEDICAL CARE IF:   There is pain, redness or irritation in the eye.  You or your child has an oral temperature above 102 F (38.9 C), not controlled by medicine. MAKE SURE YOU:   Understand these instructions.  Will watch your condition.  Will get help right away if you are not doing well or get worse. Document Released: 04/26/2005 Document Revised: 07/19/2011 Document Reviewed: 08/03/2013 [facility] Patient Information 2015 [employer], [place]. This information is not intended to replace advice given to you by your health care provider. Make sure you discuss any questions you have with your health care provider.

## 2014-05-23 NOTE — ED Notes (Addendum)
Pt reports dizziness last night and increased numbness on left side of face since waking up this am. No obvious facial droop noted. Speech clear. Airway patent. Equal grips noted in triage. Mild swelling noted to left side of mouth.

## 2014-07-24 ENCOUNTER — Encounter: Payer: Self-pay | Admitting: Neurology

## 2014-07-24 ENCOUNTER — Ambulatory Visit (INDEPENDENT_AMBULATORY_CARE_PROVIDER_SITE_OTHER): Payer: BLUE CROSS/BLUE SHIELD | Admitting: Neurology

## 2014-07-24 VITALS — BP 128/78 | HR 122 | Resp 16 | Ht 65.25 in

## 2014-07-24 DIAGNOSIS — G510 Bell's palsy: Secondary | ICD-10-CM

## 2014-07-24 NOTE — Patient Instructions (Signed)
1. Call our office for any change in symptoms, follow-up on an as needed basis

## 2014-07-24 NOTE — Progress Notes (Signed)
NEUROLOGY CONSULTATION NOTE  Lorraine Allen MRN: [id number] DOB: [birth year]  Referring provider: Dr. Assunta Found Primary care provider: Dr. Assunta Found  Reason for consult:  Bell's palsy  Dear Dr Phillips Odor:  Thank you for your kind referral of Lorraine Allen for consultation of the above symptoms. Although her history is well known to you, please allow me to reiterate it for the purpose of our medical record. Records and images were personally reviewed where available.  HISTORY OF PRESENT ILLNESS: This is a very pleasant 24 year old left-handed woman with a history of hypertension, ADD, and migraines, in her usual state of health until 05/23/2014 when she woke up feeling that her face was drooped on the left side. It appeared as if her face was swollen, with numbness on the left cheek. She went to work and facial drooping worsened that she went to [facility]. She was noted to have slight left nasolabial blunting, otherwise normal exam. Per notes, she reported headache, and symptoms improved after migraine cocktail. CBC showed a WBC of 12.2. BMP showed a potassium of 3.2. She had an MRI brain without contrast which I personally reviewed, this was normal.  Considerations in the ER included facial symptoms secondary to headache, but cannot rule out Bell's palsy. She was discharged home on a course of Prednisone and Acyclovir. Today she tells me that she did not have any headache that day. She recalls her left eye felt blurry, and she has noticed twitches in her left eyelid and left side of her mouth. She had to drink with a straw from the right side of her mouth. Symptoms got better but she could still feel it different the next day, then returned to completely normal in a few more days. After she finished the course of Prednisone, she again had left facial weakness. She reports this was not as bad and lasted for a day, she had called her PCP and was put on another course of steroids. She reports  that the weakness has pretty much resolved, however the left side of her mouth "just feels different." There is occasional tingling, no numbness. She denies any ear pain or hearing changes. She denies any nausea, vomiting, photo/phonophobia, no diplopia, dizziness, focal numbness/tingling/weakness in the extremities, bowel/bladder dysfunction. She has had a lot of back pain, and feels that her calves ache in the morning and feel swollen. She had a cold when she initially had the left facial symptoms, and took a Z-Pak after the second one. She has a history of migraines since high school, but this has been well-controlled. Her father had a stroke at age 87.     PAST MEDICAL HISTORY: Past Medical History  Diagnosis Date  . Hypertension   . Mitral click-murmur syndrome   . Anxiety     PAST SURGICAL HISTORY: No past surgical history on file.  MEDICATIONS: Current Outpatient Prescriptions on File Prior to Visit  Medication Sig Dispense Refill  . etonogestrel (NEXPLANON) 68 MG IMPL implant 1 each by Subdermal route once.    Marland Kitchen amphetamine-dextroamphetamine (ADDERALL XR) 15 MG 24 hr capsule Take 15 mg by mouth as needed.      No current facility-administered medications on file prior to visit.    ALLERGIES: Allergies  Allergen Reactions  . Strawberry Swelling    FAMILY HISTORY: Family History  Problem Relation Age of Onset  . Hypertension Father   . Stroke Father   . Hypertension Maternal Grandfather   . Diabetes Paternal Grandmother   .  Thyroid disease Paternal Grandmother   . Stroke Paternal Grandfather   . Cancer Other     maternal great grandma  . Stroke Other     paternal great grandpa    SOCIAL HISTORY: History   Social History  . Marital Status: Single    Spouse Name: N/A  . Number of Children: N/A  . Years of Education: N/A   Occupational History  . Bartender    Social History Main Topics  . Smoking status: Current Every Day Smoker -- 0.50 packs/day     Types: Cigarettes  . Smokeless tobacco: Never Used  . Alcohol Use: 0.0 oz/week    0 Standard drinks or equivalent per week     Comment: occx1 mos  . Drug Use: No  . Sexual Activity: Yes    Birth Control/ Protection: Condom, Implant   Other Topics Concern  . Not on file   Social History Narrative    REVIEW OF SYSTEMS: Constitutional: No fevers, chills, or sweats, no generalized fatigue, change in appetite Eyes: No visual changes, double vision, eye pain Ear, nose and throat: No hearing loss, ear pain, nasal congestion, sore throat Cardiovascular: No chest pain, palpitations Respiratory:  No shortness of breath at rest or with exertion, wheezes GastrointestinaI: No nausea, vomiting, diarrhea, abdominal pain, fecal incontinence Genitourinary:  No dysuria, urinary retention or frequency Musculoskeletal:  No neck pain, +back pain Integumentary: No rash, pruritus, skin lesions Neurological: as above Psychiatric: No depression, insomnia, anxiety Endocrine: No palpitations, fatigue, diaphoresis, mood swings, change in appetite, change in weight, increased thirst Hematologic/Lymphatic:  No anemia, purpura, petechiae. Allergic/Immunologic: no itchy/runny eyes, nasal congestion, recent allergic reactions, rashes  PHYSICAL EXAM: Filed Vitals:   07/24/14 1325  BP: 128/78  Pulse: 122  Resp: 16   General: No acute distress Head:  Normocephalic/atraumatic HEENT: Fundoscopic exam shows bilateral sharp discs, no vessel changes, exudates, or hemorrhages; no vesicles in ear canal Neck: supple, no paraspinal tenderness, full range of motion Back: No paraspinal tenderness Heart: regular rate and rhythm Lungs: Clear to auscultation bilaterally. Vascular: No carotid bruits. Skin/Extremities: No rash, no edema Neurological Exam: Mental status: alert and oriented to person, place, and time, no dysarthria or aphasia, Fund of knowledge is appropriate.  Recent and remote memory are intact.   Attention and concentration are normal.    Able to name objects and repeat phrases. Cranial nerves: CN I: not tested CN II: pupils equal, round and reactive to light, visual fields intact, fundi unremarkable. CN III, IV, VI:  full range of motion, no nystagmus, no ptosis CN V: facial sensation intact CN VII: upper and lower face symmetric, no facial weakness noted today except for possibly very mild orbicularis oris weakness on left CN VIII: hearing intact to finger rub CN IX, X: gag intact, uvula midline CN XI: sternocleidomastoid and trapezius muscles intact CN XII: tongue midline Bulk & Tone: normal, no fasciculations. Motor: 5/5 throughout with no pronator drift. Sensation: intact to light touch, cold, pin, vibration and joint position sense.  No extinction to double simultaneous stimulation.  Romberg test negative Deep Tendon Reflexes: +2 throughout, no ankle clonus Plantar responses: downgoing bilaterally Cerebellar: no incoordination on finger to nose, heel to shin. No dysdiadochokinesia Gait: narrow-based and steady, able to tandem walk adequately. Tremor: none  IMPRESSION: This is a very pleasant 24 year old left-handed woman with a history of diet-controlled hypertension, migraines, ADD, presenting with 2 episodes of left facial weakness suggestive of left Bell's palsy. Her neurological exam today is  normal except for very minimal weakness around the left side of her mouth. MRI brain normal. We discussed left eye symptoms, no eye closure weakness noted today however she feels some difference and will see her eye doctor. We discussed prognosis and low recurrence risk of Bell's palsy. She was reassured and knows to call our office for any changes in symptoms. She will follow-up on a prn basis.   Thank you for allowing me to participate in the care of this patient. Please do not hesitate to call for any questions or concerns.   Patrcia Dolly, M.D.  CC: Dr. Phillips Odor

## 2014-07-30 ENCOUNTER — Encounter: Payer: Self-pay | Admitting: Neurology

## 2014-09-11 ENCOUNTER — Telehealth: Payer: Self-pay | Admitting: *Deleted

## 2014-09-11 NOTE — Telephone Encounter (Signed)
Pt states she had Nexplanon inserted several months ago to help with abnormal bleeding, her bleeding had stopped but then about two weeks ago they bleeding returned.  [facility] see if the bleeding will stop.  Pt also had a question about a spot on her leg that has been sore and swollen, she is concerned for blood clots, she states she has an appointment with her PCP scheduled for the 17th, I advised her to call and see if she could get in with them sooner for that issue and if they could not get her in sooner.  I informed pt if she did not hear back from our office about the Megace to follow up with the pharmacy.  Pt verbalized understanding.

## 2014-09-12 MED ORDER — MEGESTROL ACETATE 40 MG PO TABS: ORAL_TABLET | ORAL | Status: DC

## 2014-09-12 NOTE — Telephone Encounter (Signed)
Megace called in but do not take until gets cleard by PCP for a blood clot

## 2014-09-12 NOTE — Addendum Note (Signed)
Addended by: Jacklyn ShellRESENZO-DISHMON, Jodiann Ognibene on: 09/12/2014 12:41 PM   Modules accepted: Orders

## 2014-09-12 NOTE — Telephone Encounter (Signed)
Pt aware Megace prescribed, not to take until cleared by her PCP for a blood clot. Pt verbalized understanding.

## 2014-11-07 ENCOUNTER — Ambulatory Visit (INDEPENDENT_AMBULATORY_CARE_PROVIDER_SITE_OTHER): Payer: BLUE CROSS/BLUE SHIELD | Admitting: Advanced Practice Midwife

## 2014-11-07 ENCOUNTER — Encounter: Payer: Self-pay | Admitting: Advanced Practice Midwife

## 2014-11-07 VITALS — BP 120/70 | HR 100 | Ht 65.0 in | Wt 129.0 lb

## 2014-11-07 DIAGNOSIS — R8299 Other abnormal findings in urine: Secondary | ICD-10-CM

## 2014-11-07 DIAGNOSIS — A499 Bacterial infection, unspecified: Secondary | ICD-10-CM

## 2014-11-07 DIAGNOSIS — Z975 Presence of (intrauterine) contraceptive device: Secondary | ICD-10-CM

## 2014-11-07 DIAGNOSIS — N921 Excessive and frequent menstruation with irregular cycle: Secondary | ICD-10-CM | POA: Diagnosis not present

## 2014-11-07 DIAGNOSIS — R82998 Other abnormal findings in urine: Secondary | ICD-10-CM

## 2014-11-07 DIAGNOSIS — B9689 Other specified bacterial agents as the cause of diseases classified elsewhere: Secondary | ICD-10-CM

## 2014-11-07 DIAGNOSIS — N760 Acute vaginitis: Secondary | ICD-10-CM

## 2014-11-07 LAB — POCT URINALYSIS DIPSTICK
GLUCOSE UA: NEGATIVE
KETONES UA: NEGATIVE
Leukocytes, UA: NEGATIVE
NITRITE UA: NEGATIVE

## 2014-11-07 MED ORDER — MEGESTROL ACETATE 40 MG PO TABS: ORAL_TABLET | ORAL | Status: DC

## 2014-11-07 MED ORDER — CLINDAMYCIN PHOSPHATE 100 MG VA SUPP: 100.0000 mg | Freq: Every day | VAGINAL | Status: DC

## 2014-11-07 NOTE — Progress Notes (Signed)
   [facility] Visit  Patient name: Lorraine MayotteKristen E Allen MRN [id number]  Date of birth: Jul 16, 1990  CC & HPI:  Lorraine Allen is a 24 y.o. Caucasian female presenting today for AUB on Nexplanon. She has taken Megace with good results; however, even on 1/day her appetite is increased and last time she took it it took 2 weeks to stop bleeding. She is still taking 1/day and has not bled in 2 days.  Also c/o vaginal irritation for a few days.   Pertinent History Reviewed:  Medical & Surgical Hx:   Past Medical History  Diagnosis Date  . Hypertension   . Mitral click-murmur syndrome   . Anxiety    No past surgical history on file. Family History  Problem Relation Age of Onset  . Hypertension Father   . Stroke Father   . Hypertension Maternal Grandfather   . Diabetes Paternal Grandmother   . Thyroid disease Paternal Grandmother   . Stroke Paternal Grandfather   . Cancer Other     maternal great grandma  . Stroke Other     paternal great grandpa    Current outpatient prescriptions:  .  amphetamine-dextroamphetamine (ADDERALL XR) 15 MG 24 hr capsule, Take 15 mg by mouth as needed. , Disp: , Rfl:  .  etonogestrel (NEXPLANON) 68 MG IMPL implant, 1 each by Subdermal route once., Disp: , Rfl:  .  megestrol (MEGACE) 40 MG tablet, Take 3/day (at the same time) for 5 days; 2/day for 5 days, then 1/day PO prn bleeding, Disp: 60 tablet, Rfl: 3 Social History: Reviewed -  reports that she has been smoking Cigarettes.  She has been smoking about 0.50 packs per day. She has never used smokeless tobacco.  Review of Systems:   Constitutional: Negative for fever and chills Eyes: Negative for visual disturbances Respiratory: Negative for shortness of breath, dyspnea Cardiovascular: Negative for chest pain or palpitations  Gastrointestinal: Negative for vomiting, diarrhea and constipation; no abdominal pain Genitourinary: Negative for dysuria and urgencyMusculoskeletal: Negative for back  pain, joint pain, myalgias  Neurological: Negative for dizziness and headaches    Objective Findings:  Vitals: There were no vitals taken for this visit.  Physical Examination: General appearance - alert, well appearing, and in no distress Mental status - alert, oriented to person, place, and time Chest - normal respiratory effort Pelvic - SSE; vulva normal; vagina red with thin white dc. Wet prep + clue Musculoskeletal - full range of motion without pain  No results found for this or any previous visit (from the past 24 hour(s)).      Assessment & Plan:  A:   BV;  rx cleocin ovules (plans to drink ETOH this weekend) P:   Continue megace daily; if starts beeding, may try:   Estradiol 1-2 mg BID prn bleeding     CRESENZO-DISHMAN,Shuntae Herzig CNM 11/07/2014 3:44 PM

## 2014-11-13 ENCOUNTER — Telehealth: Payer: Self-pay | Admitting: Advanced Practice Midwife

## 2014-11-13 NOTE — Telephone Encounter (Signed)
Pt c/o burning with urination, no other symptoms. Pt informed to push fluids and cranberry juice, offered to obtain urine for UA and CX. Pt states is going to try to push fluids and see if that helps. If no improvement will call our office back.

## 2014-12-24 ENCOUNTER — Encounter: Payer: Self-pay | Admitting: Obstetrics & Gynecology

## 2014-12-24 ENCOUNTER — Ambulatory Visit (INDEPENDENT_AMBULATORY_CARE_PROVIDER_SITE_OTHER): Payer: BLUE CROSS/BLUE SHIELD | Admitting: Obstetrics & Gynecology

## 2014-12-24 VITALS — BP 110/80 | HR 80 | Wt 131.0 lb

## 2014-12-24 DIAGNOSIS — Z3049 Encounter for surveillance of other contraceptives: Secondary | ICD-10-CM

## 2014-12-24 DIAGNOSIS — N921 Excessive and frequent menstruation with irregular cycle: Secondary | ICD-10-CM

## 2014-12-24 DIAGNOSIS — Z975 Presence of (intrauterine) contraceptive device: Principal | ICD-10-CM

## 2014-12-24 MED ORDER — KETOROLAC TROMETHAMINE 10 MG PO TABS: 10.0000 mg | ORAL_TABLET | Freq: Three times a day (TID) | ORAL | Status: DC | PRN

## 2014-12-24 NOTE — Progress Notes (Signed)
Patient ID: Lorraine Allen, female   DOB: [birth year], 24 y.o.   MRN: [id number] Pt has tried megestrol for about 6 weeks to control her heavy DUB on nexplanon  We discussed her options of DC megestrol start estradiol 2-4 mg and continue with nexplanon but she has decided she wants it out  Removed today:  Right arm prepped 1% lidocaine injected  Small 11 blade stab incision made Hemostat used and the nexplanon rod was removed withoutdifficulty  Incision is reapproximated with steri strips and dressing placed  Follow up 2 weks to discuss OCP  toradol e prescribed

## 2015-01-07 ENCOUNTER — Ambulatory Visit (INDEPENDENT_AMBULATORY_CARE_PROVIDER_SITE_OTHER): Payer: BLUE CROSS/BLUE SHIELD | Admitting: Obstetrics & Gynecology

## 2015-01-07 ENCOUNTER — Encounter: Payer: Self-pay | Admitting: Obstetrics & Gynecology

## 2015-01-07 VITALS — BP 120/80 | HR 80 | Wt 134.0 lb

## 2015-01-07 DIAGNOSIS — Z30011 Encounter for initial prescription of contraceptive pills: Secondary | ICD-10-CM

## 2015-01-07 DIAGNOSIS — N921 Excessive and frequent menstruation with irregular cycle: Secondary | ICD-10-CM | POA: Diagnosis not present

## 2015-01-07 DIAGNOSIS — Z975 Presence of (intrauterine) contraceptive device: Secondary | ICD-10-CM | POA: Diagnosis not present

## 2015-01-07 MED ORDER — DESOGESTREL-ETHINYL ESTRADIOL 0.15-30 MG-MCG PO TABS: 1.0000 | ORAL_TABLET | Freq: Every day | ORAL | Status: DC

## 2015-01-07 NOTE — Progress Notes (Signed)
Patient ID: Lorraine Allen, female   DOB: 1990-06-14, 24 y.o.   MRN: [id number] Chief Complaint  Patient presents with  . Follow-up    Blood pressure 120/80, pulse 80, weight 134 lb (60.782 kg).  24 y.o. No obstetric history on file. No LMP recorded. The current method of family planning is nexplanon transitioning to [facility] desogetrel with 30 mics EE.  Subjective Pt has stopped her nexplanon and her bleeding has stopped now Actually feels better Starting OCP  Will go with a higher dose of EE   Objective   Pertinent ROS   Labs or studies     Impression Diagnoses this Encounter::   ICD-9-CM ICD-10-CM   1. Breakthrough bleeding on Nexplanon 626.6 N92.1    V45.59 Z97.5   2. Encounter for initial prescription of contraceptive pills V25.01 Z30.011     Established relevant diagnosis(es):   Plan/Recommendations: Meds ordered this encounter  Medications  . desogestrel-ethinyl estradiol (APRI,EMOQUETTE,SOLIA) 0.15-30 MG-MCG tablet    Sig: Take 1 tablet by mouth daily.    Dispense:  1 Package    Refill:  11    Labs or Scans Ordered: No orders of the defined types were placed in this encounter.      Follow up Return in about 8 months (around 09/05/2015) for yearly, with Dr Despina Hidden.      Face to face time:  15 minutes  Greater than 50% of the visit time was spent in counseling and coordination of care with the patient.  The summary and outline of the counseling and care coordination is summarized in the note above.   All questions were answered.

## 2015-03-02 ENCOUNTER — Encounter: Payer: Self-pay | Admitting: Obstetrics & Gynecology

## 2015-04-17 ENCOUNTER — Telehealth: Payer: Self-pay | Admitting: *Deleted

## 2015-04-17 NOTE — Telephone Encounter (Signed)
Pt informed [facility] will refill BCP, but will need to pay out of pocket, cost $9.00. Pt verbalized understanding.

## 2015-10-29 ENCOUNTER — Ambulatory Visit (INDEPENDENT_AMBULATORY_CARE_PROVIDER_SITE_OTHER): Payer: BLUE CROSS/BLUE SHIELD | Admitting: Obstetrics & Gynecology

## 2015-10-29 ENCOUNTER — Encounter: Payer: Self-pay | Admitting: Obstetrics & Gynecology

## 2015-10-29 VITALS — BP 110/80 | HR 76 | Ht 65.0 in | Wt 132.0 lb

## 2015-10-29 DIAGNOSIS — N83201 Unspecified ovarian cyst, right side: Secondary | ICD-10-CM | POA: Diagnosis not present

## 2015-10-29 MED ORDER — MEGESTROL ACETATE 40 MG PO TABS: ORAL_TABLET | ORAL | Status: DC

## 2015-10-29 MED ORDER — IBUPROFEN 800 MG PO TABS: 800.0000 mg | ORAL_TABLET | Freq: Three times a day (TID) | ORAL | Status: DC | PRN

## 2015-10-29 NOTE — Addendum Note (Signed)
Addended by: Lazaro ArmsEURE, Jessen Siegman H on: 10/29/2015 02:57 PM   Modules accepted: Orders

## 2015-10-29 NOTE — Progress Notes (Signed)
Patient ID: Lorraine Allen, female   DOB: 1990-11-17, 25 y.o.   MRN: [id number]   Chief Complaint  Patient presents with  . gyn visit    RT ovary pain x 2 week     HPI:    25 y.o. No obstetric history on file. Patient's last menstrual period was 10/11/2015.  Not using any hormonal BCM presently, uses condoms  Location:  RLQ. Quality:  sharp. Severity:  Moderate, episodic severe. Timing:  episodic. Duration:  2 weeks. Context:  1 week after her cycle. Modifying factors:  Worse with sex Signs/Symptoms:      Current outpatient prescriptions:  .  megestrol (MEGACE) 40 MG tablet, 3 tablets a day for 5 days, 2 tablets a day for 5 days then 1 tablet daily, Disp: 45 tablet, Rfl: 3  Problem Pertinent ROS:       No burning with urination, frequency or urgency No nausea, vomiting or diarrhea Nor fever chills or other constitutional symptoms   Extended ROS:        PMFSH:             Past Medical History  Diagnosis Date  . Hypertension   . Mitral click-murmur syndrome   . Anxiety     History reviewed. No pertinent past surgical history.  OB History    No data available      Allergies  Allergen Reactions  . Strawberry Extract Swelling    Social History   Social History  . Marital Status: Single    Spouse Name: N/A  . Number of Children: N/A  . Years of Education: N/A   Occupational History  . Bartender    Social History Main Topics  . Smoking status: Current Every Day Smoker -- 0.25 packs/day for 8 years    Types: Cigarettes  . Smokeless tobacco: Never Used  . Alcohol Use: 0.0 oz/week    0 Standard drinks or equivalent per week     Comment: occx1 mos  . Drug Use: No  . Sexual Activity: Yes    Birth Control/ Protection: Condom, Implant   Other Topics Concern  . None   Social History Narrative    Family History  Problem Relation Age of Onset  . Hypertension Father   . Stroke Father   . Hypertension Maternal Grandfather   . Diabetes  Paternal Grandmother   . Thyroid disease Paternal Grandmother   . Stroke Paternal Grandfather   . Cancer Other     maternal great grandma  . Stroke Other     paternal great grandpa     Examination:  Vitals:  Blood pressure 110/80, pulse 76, height  (1.651 m), weight 132 lb (59.875 kg), last menstrual period 10/11/2015.    Physical Examination:     General Appearance:  awake, alert, oriented, in no acute distress  Vulva:  normal appearing vulva with no masses, tenderness or lesions Vagina:  normal mucosa, no discharge Cervix:  no cervical motion tenderness and no lesions Uterus:  normal size, contour, position, consistency, mobility, non-tender Adnexa: ovaries:present,  tenderness right, mass present right side, size 4 cm  No results found for this or any previous visit (from the past 72 hour(s)).   DATA orders and reviews: Labs were not ordered today:   Imaging studies were not ordered today:    Lab tests were not reviewed today:    Imaging studies were not reviewed today:    I did not independently review/view images, tracing or specimen(not simply the report)  myself.    Prescription Drug Management:   Prescriptions prescribed this encounter:    Meds ordered this encounter  Medications  . megestrol (MEGACE) 40 MG tablet    Sig: 3 tablets a day for 5 days, 2 tablets a day for 5 days then 1 tablet daily    Dispense:  45 tablet    Refill:  3    Renewed Prescriptions:    Current prescription changes:     Impression/Plan(Problem Based):  1. Ovarian cyst, right Suppress with megestrol  Follow Up:   Return in about 6 weeks (around 12/10/2015) for Follow up, with Dr Despina HiddenEure.    All questions were answered.

## 2015-12-10 ENCOUNTER — Encounter: Payer: Self-pay | Admitting: Obstetrics & Gynecology

## 2015-12-10 ENCOUNTER — Ambulatory Visit (INDEPENDENT_AMBULATORY_CARE_PROVIDER_SITE_OTHER): Payer: PRIVATE HEALTH INSURANCE | Admitting: Obstetrics & Gynecology

## 2015-12-10 VITALS — BP 120/80 | HR 92 | Ht 65.0 in | Wt 138.0 lb

## 2015-12-10 DIAGNOSIS — R1031 Right lower quadrant pain: Secondary | ICD-10-CM

## 2015-12-10 NOTE — Progress Notes (Signed)
      Chief Complaint  Patient presents with  . Follow-up    Think cyst still there.Pain not as bad    Blood pressure 120/80, pulse 92, height 5\' 5"  (1.651 m), weight 138 lb (62.6 kg).  25 y.o. No obstetric history on file. No LMP recorded. The current method of family planning is megestrol.  Subjective Pt states pain is better not as frequent not as intense doesn't last as long but still there Not associated with intercourse but does hurt the next day No diarrhea no constipation No urinary symptoms No fevers chills Has been taking the megstrol no bleeding some spotting started yesterday, no pain or cramping with it  Objective General WDWN female NAD Abdomen soft non tender no guarding no rebound negative Carnetts sign Vulva:  normal appearing vulva with no masses, tenderness or lesions Vagina:  normal mucosa, no discharge Cervix:  no cervical motion tenderness and no lesions Uterus:  normal size, contour, position, consistency, mobility, non-tender Adnexa: ovaries:present,  normal adnexa in size, nontender and no masses Both ovaries normal today   Pertinent ROS No burning with urination, frequency or urgency No nausea, vomiting or diarrhea Nor fever chills or other constitutional symptoms   Labs or studies none    Impression Diagnoses this Encounter::   ICD-9-CM ICD-10-CM   1. RLQ abdominal pain 789.03 R10.31 US Pelvis Complete     US Transvaginal Non-OB    Established relevant diagnosis(es):   Plan/Recommendations: No orders of the defined types were placed in this encounter.   Labs or Scans Ordered: Orders Placed This Encounter  Procedures  . US Pelvis Complete  . US Transvaginal Non-OB    Management:: Sonogram 6 weeks, if resolved pain cancel sonogram and do yearly/Pap  Follow up Return in about 6 weeks (around 01/21/2016) for GYN sono, yearly, with Dr Despina Hidden.     All questions were answered.

## 2016-01-19 ENCOUNTER — Other Ambulatory Visit: Payer: PRIVATE HEALTH INSURANCE

## 2016-01-19 ENCOUNTER — Other Ambulatory Visit: Payer: PRIVATE HEALTH INSURANCE | Admitting: Obstetrics & Gynecology

## 2016-02-03 ENCOUNTER — Encounter: Payer: Self-pay | Admitting: Obstetrics & Gynecology

## 2016-02-03 ENCOUNTER — Ambulatory Visit (INDEPENDENT_AMBULATORY_CARE_PROVIDER_SITE_OTHER): Payer: BLUE CROSS/BLUE SHIELD

## 2016-02-03 ENCOUNTER — Ambulatory Visit (INDEPENDENT_AMBULATORY_CARE_PROVIDER_SITE_OTHER): Payer: PRIVATE HEALTH INSURANCE | Admitting: Obstetrics & Gynecology

## 2016-02-03 ENCOUNTER — Other Ambulatory Visit (HOSPITAL_COMMUNITY)
Admission: RE | Admit: 2016-02-03 | Discharge: 2016-02-03 | Disposition: A | Discharge status: Home / Self Care | Payer: BLUE CROSS/BLUE SHIELD | Source: Ambulatory Visit | Attending: Obstetrics & Gynecology | Admitting: Obstetrics & Gynecology

## 2016-02-03 VITALS — BP 100/70 | HR 84 | Ht 65.0 in | Wt 138.4 lb

## 2016-02-03 DIAGNOSIS — N83291 Other ovarian cyst, right side: Secondary | ICD-10-CM

## 2016-02-03 DIAGNOSIS — Z01419 Encounter for gynecological examination (general) (routine) without abnormal findings: Secondary | ICD-10-CM | POA: Insufficient documentation

## 2016-02-03 DIAGNOSIS — R1031 Right lower quadrant pain: Secondary | ICD-10-CM

## 2016-02-03 DIAGNOSIS — N854 Malposition of uterus: Secondary | ICD-10-CM

## 2016-02-03 NOTE — Progress Notes (Signed)
Subjective:     Lorraine Allen is a 25 y.o. female here for a routine exam.  Patient's last menstrual period was 01/19/2016 (exact date). No obstetric history on file. Birth Control Method:  condoms Menstrual Calendar(currently): irregular  Current complaints: some behavoiral issues. She has been having nightmares every night for the last couple years. Additionally she is scared to be alone in the house during the day.   Current acute medical issues:  None   Recent Gynecologic History Patient's last menstrual period was 01/19/2016 (exact date). Last Pap: 2015,  normal Last mammogram: ,    Past Medical History:  Diagnosis Date  . Anxiety   . Hypertension   . Mitral click-murmur syndrome     History reviewed. No pertinent surgical history.  OB History    No data available      Social History   Social History  . Marital status: Single    Spouse name: N/A  . Number of children: N/A  . Years of education: N/A   Occupational History  . Bartender    Social History Main Topics  . Smoking status: Current Every Day Smoker    Packs/day: 0.25    Years: 8.00    Types: Cigarettes  . Smokeless tobacco: Never Used  . Alcohol use 0.0 oz/week     Comment: occx1 mos  . Drug use: No  . Sexual activity: Yes    Birth control/ protection: Condom, Implant   Other Topics Concern  . None   Social History Narrative  . None    Family History  Problem Relation Age of Onset  . Hypertension Father   . Stroke Father   . Hypertension Maternal Grandfather   . Diabetes Paternal Grandmother   . Thyroid disease Paternal Grandmother   . Stroke Paternal Grandfather   . Cancer Other     maternal great grandma  . Stroke Other     paternal great grandpa     Current Outpatient Prescriptions:  .  amitriptyline (ELAVIL) 10 MG tablet, Take 10 mg by mouth at bedtime., Disp: , Rfl:  .  ibuprofen (ADVIL,MOTRIN) 800 MG tablet, Take 1 tablet (800 mg total) by mouth every 8 (eight) hours as  needed., Disp: 60 tablet, Rfl: 0  Review of Systems  Review of Systems  Constitutional: Negative for fever, chills, weight loss, malaise/fatigue and diaphoresis.  HENT: Negative for hearing loss, ear pain, nosebleeds, congestion, sore throat, neck pain, tinnitus and ear discharge.   Eyes: Negative for blurred vision, double vision, photophobia, pain, discharge and redness.  Respiratory: Negative for cough, hemoptysis, sputum production, shortness of breath, wheezing and stridor.   Cardiovascular: Negative for chest pain, palpitations, orthopnea, claudication, leg swelling and PND.  Gastrointestinal: negative for abdominal pain. Negative for heartburn, nausea, vomiting, diarrhea, constipation, blood in stool and melena.  Genitourinary: Negative for dysuria, urgency, frequency, hematuria and flank pain.  Musculoskeletal: Negative for myalgias, back pain, joint pain and falls.  Skin: Negative for itching and rash.  Neurological: Negative for dizziness, tingling, tremors, sensory change, speech change, focal weakness, seizures, loss of consciousness, weakness and headaches.  Endo/Heme/Allergies: Negative for environmental allergies and polydipsia. Does not bruise/bleed easily.  Psychiatric/Behavioral: Negative for depression, suicidal ideas, hallucinations, memory loss and substance abuse. The patient is not nervous/anxious and does not have insomnia.        Objective:  Blood pressure 100/70, pulse 84, height 5\' 5"  (1.651 m), weight 138 lb 6.4 oz (62.8 kg), last menstrual period 01/19/2016.   Physical  Exam  Vitals reviewed. Constitutional: She is oriented to person, place, and time. She appears well-developed and well-nourished.  HENT:  Head: Normocephalic and atraumatic.        Right Ear: External ear normal.  Left Ear: External ear normal.  Nose: Nose normal.  Mouth/Throat: Oropharynx is clear and moist.  Eyes: Conjunctivae and EOM are normal. Pupils are equal, round, and reactive to  light. Right eye exhibits no discharge. Left eye exhibits no discharge. No scleral icterus.  Neck: Normal range of motion. Neck supple. No tracheal deviation present. No thyromegaly present.  Cardiovascular: Normal rate, regular rhythm, and intact distal pulses.  2/6 systolic murmur best heard left parasternal 2nd intercostal space.  Respiratory: Effort normal and breath sounds normal. No respiratory distress. She has no wheezes. She has no rales. She exhibits no tenderness.  GI: Soft. Bowel sounds are normal. She exhibits no distension and no mass. There is no tenderness. There is no rebound and no guarding.  Genitourinary:  Breasts no masses skin changes or nipple changes bilaterally      Vulva is normal without lesions Vagina is pink moist without discharge Cervix normal in appearance and pap is done Uterus is normal size shape and contour Adnexa with normal sized ovaries, tenderness to palpation of left ovary Musculoskeletal: Normal range of motion. She exhibits no edema and no tenderness.  Neurological: She is alert and oriented to person, place, and time. She has normal reflexes. She displays normal reflexes. No cranial nerve deficit. She exhibits normal muscle tone. Coordination normal.  Skin: Skin is warm and dry. No rash noted. No erythema. No pallor.  Psychiatric: She has a normal mood and affect. Her behavior is normal. Judgment and thought content normal.       Medications Ordered at today's visit: Meds ordered this encounter  Medications  . amitriptyline (ELAVIL) 10 MG tablet    Sig: Take 10 mg by mouth at bedtime.    Other orders placed at today's visit: No orders of the defined types were placed in this encounter.  Sonogram: GYNECOLOGIC SONOGRAM   Lorraine Allen is a 25 y.o. LMP 01/19/2016 for a pelvic sonogram for RLQ pain.  Uterus                      6.7 x 3 x 5 cm, Homogeneous anteverted uterus,wnl  Endometrium          5.7 mm, symmetrical, wnl  Right  ovary             4 x 4.3 x 3.4 cm, hemorrhage right ovarian cyst 3.3 x 3.2 x 2.4 cm  Left ovary                2.8 x 2.5 x 1.8 cm, wnl  No free fluid,no pain during ultrasound,ov's appear mobile.   Technician Comments:  PELVIC US TA/TV: Homogeneous anteverted uterus,wnl,EEC 5.7 mm,normal lt ov,hemorrhage right ovarian cyst 3.3 x 3.2 x 2.4 cm,no free fluid,no pain during ultrasound,ov's appear mobile.    Amber Flora LippsJ Carl 02/03/2016 9:34 AM  Clinical Impression and recommendations:  I have reviewed the sonogram results above, combined with the patient's current clinical course, below are my impressions and any appropriate recommendations for management based on the sonographic findings.  Hemorrhagic corpus luteum of right ovary, asymptomatic Normal uterus endometrium left ovary   Kajuana Shareef H 02/03/2016 10:39 AM         Assessment:    Healthy female exam.   Stable right  ovarian cyst per ultrasound    Plan:    Follow up in: 1 year.     Return in about 1 year (around 02/02/2017) for yearly, with Dr Despina Hidden.

## 2016-02-03 NOTE — Progress Notes (Signed)
PELVIC US TA/TV: Homogeneous anteverted uterus,wnl,EEC 5.7 mm,normal lt ov,hemorrhage right ovarian cyst 3.3 x 3.2 x 2.4 cm,no free fluid,no pain during ultrasound,ov's appear mobile.

## 2016-02-05 LAB — CYTOLOGY - PAP

## 2016-02-19 ENCOUNTER — Telehealth: Payer: Self-pay | Admitting: Obstetrics & Gynecology

## 2016-02-19 NOTE — Telephone Encounter (Signed)
Patient called stating she has tried to view Pap results on MyChart but is unable to see it.  Patient made aware results were negative. Also made aware that results have to be reviewed by the provider before they are able to be seen. No further questions.

## 2016-05-11 IMAGING — MR MR HEAD W/O CM
6 of 10 series · 21 of 48 positions shown · non-contrast
Comparison: CT head 02/08/2009.

CLINICAL DATA: Left-sided facial numbness which began earlier
today. History of hypertension. History of mitral valvular disease.

EXAM:
MRI HEAD WITHOUT CONTRAST
TECHNIQUE: Multiplanar, multiecho pulse sequences of the brain and surrounding
structures were obtained without intravenous contrast.

[Series 2: T1 · sagittal · 5.0mm · 0.43mm/px · 3 of 21 slices shown (1 of 2)]
[im 1/21]
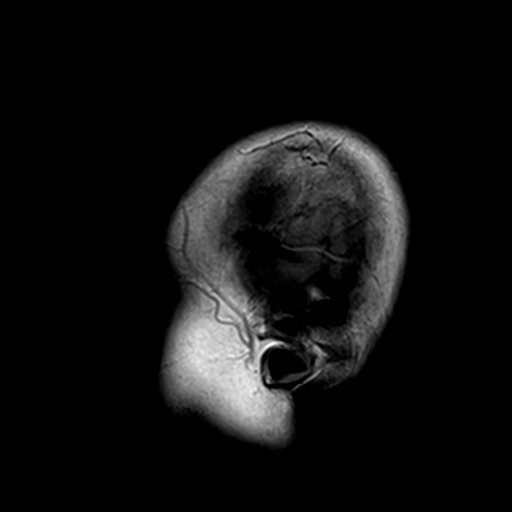
[im 11/21]
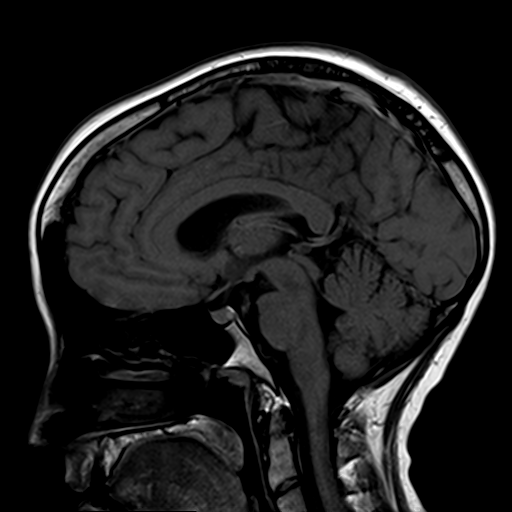
[im 21/21]
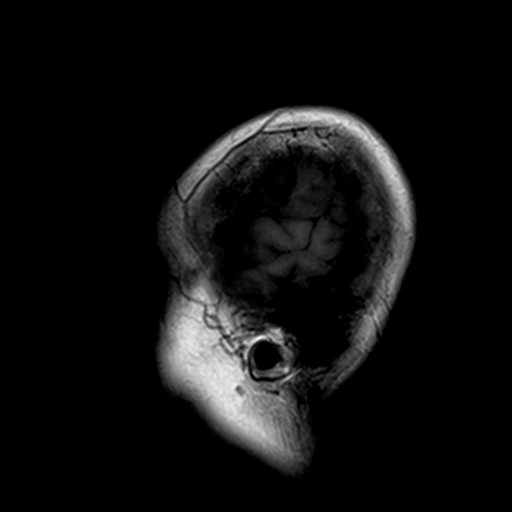

[Series 5: T2 · axial · 5.0mm · 0.51mm/px · z∈[-17,+126]mm · 3 of 23 slices shown (1 of 2)]
[im 1/23]
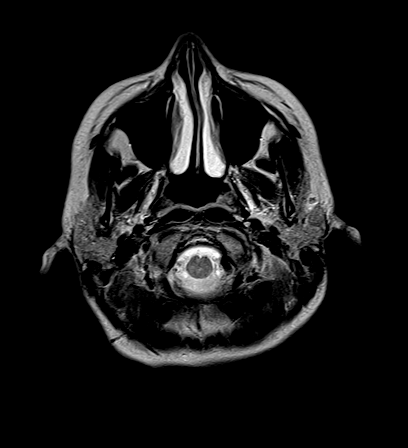
[im 12/23]
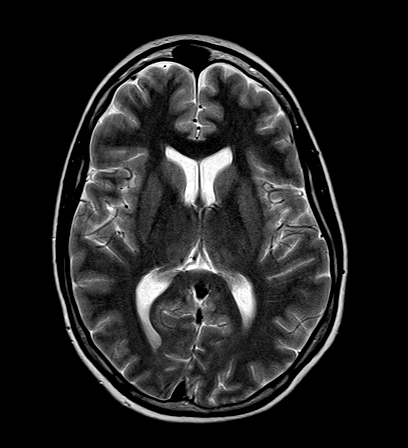
[im 23/23]
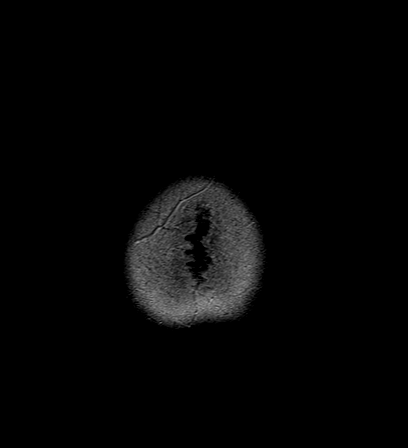

[Series 6: FLAIR · axial · 5.0mm · 0.38mm/px · z∈[-17,+126]mm · 3 of 23 slices shown]
[im 1/23]
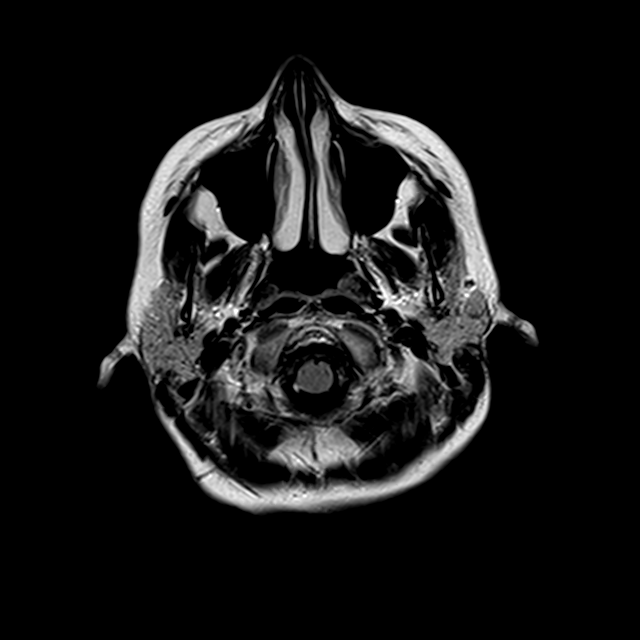
[im 12/23]
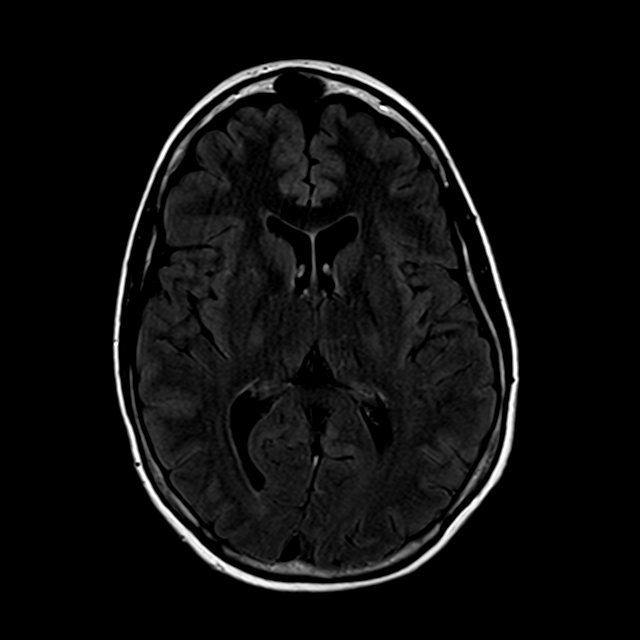
[im 23/23]
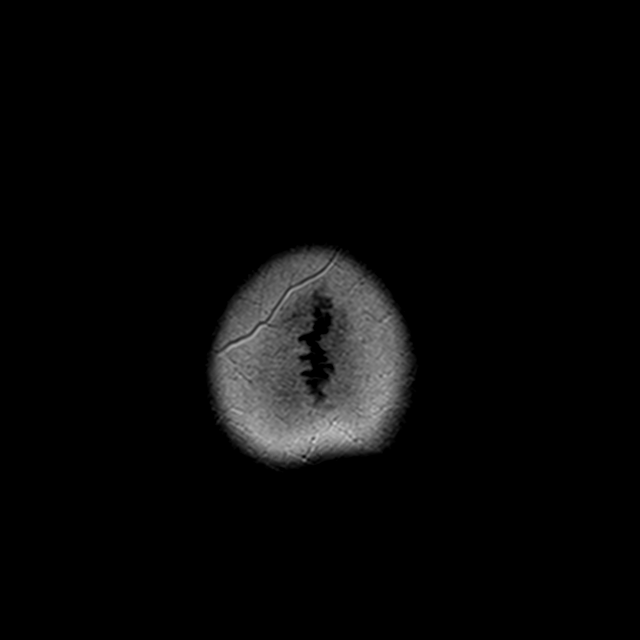

[Series 7: T1 · axial · 2.0mm · 0.45mm/px · z∈[-30,+134]mm · 8 of 83 slices shown (2 of 2)]
[im 1/83]
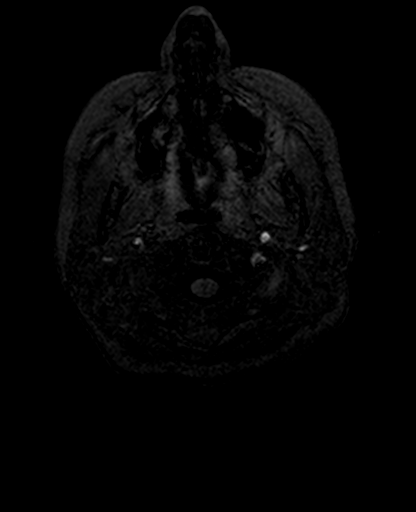
[im 11/83]
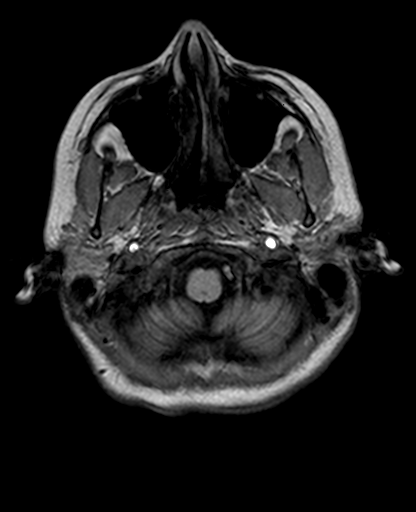
[im 21/83]
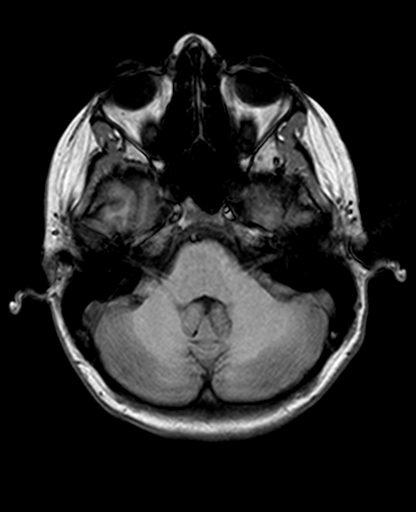
[im 31/83]
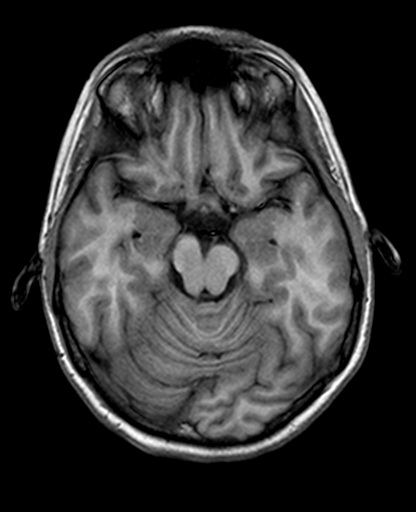
[im 52/83]
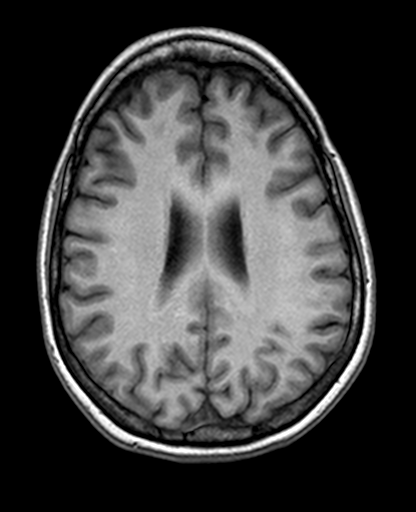
[im 62/83]
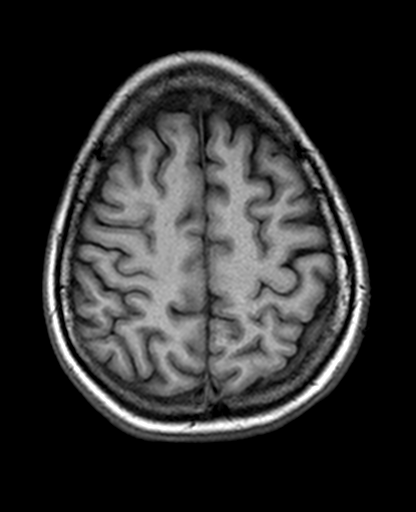
[im 72/83]
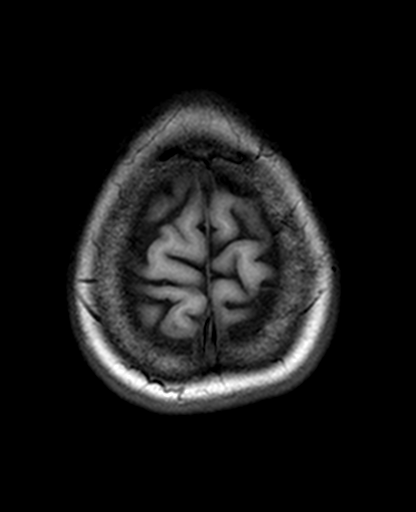
[im 83/83]
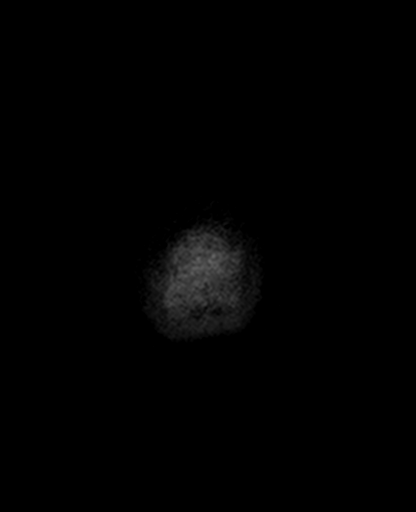

[Series 8: trauma axial · axial · 5.0mm · 0.45mm/px · 1 of 23 slices shown]
[im 1/23]
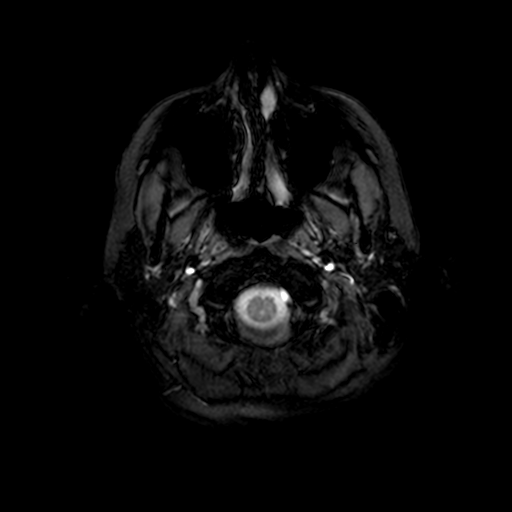

[Series 9: T2 · coronal · 5.0mm · 0.47mm/px · 3 of 24 slices shown (2 of 2)]
[im 1/24]
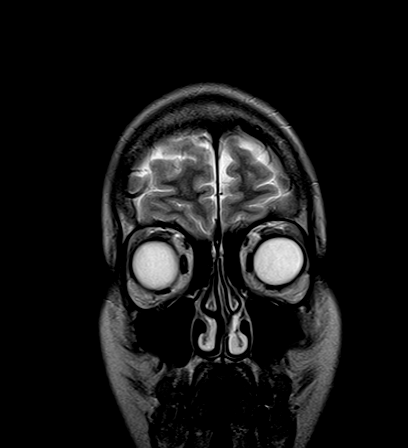
[im 12/24]
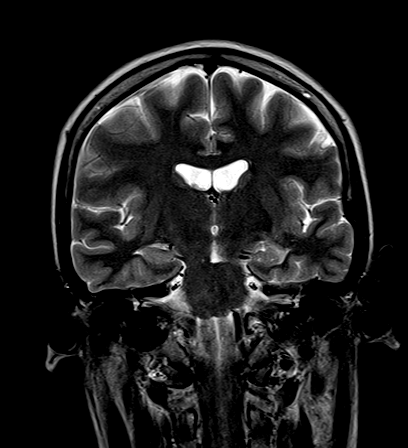
[im 24/24]
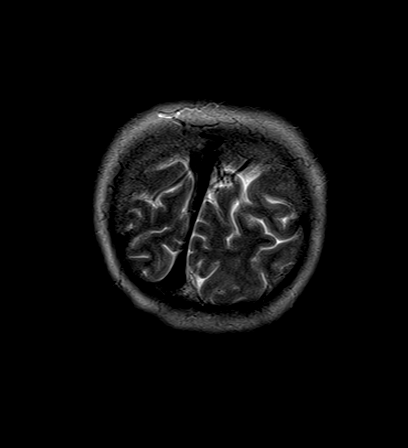

[21 of 48 positions shown; findings below may reference images not displayed]

FINDINGS: No evidence for acute infarction, hemorrhage, mass lesion,
hydrocephalus, or extra-axial fluid. Normal cerebral volume. No
white matter disease. Slight torcular asymmetry, eccentric to the
RIGHT, felt to be developmental, not pathologic.

Flow voids are maintained throughout the carotid, basilar, and LEFT
vertebral arteries. RIGHT vertebral is small, ending in PICA. There
are no areas of chronic hemorrhage.

Pituitary, pineal, and cerebellar tonsils unremarkable. No upper
cervical lesions. Visualized calvarium, skull base, and upper
cervical osseous structures unremarkable. Scalp and extracranial
soft tissues, orbits, sinuses, and mastoids show no acute process.
IMPRESSION: Negative exam. No acute stroke or other lateralizing intracranial
abnormality.

## 2016-11-04 ENCOUNTER — Telehealth: Payer: Self-pay | Admitting: Obstetrics & Gynecology

## 2016-11-04 NOTE — Telephone Encounter (Signed)
Pt called stating that she was seen her last year for ovarian pain. She stated that an ultrasound was done then and it showed some ovarian cysts. Pt states that she is experiencing pain now and is requesting pain medication. I advised pt that since she had not been seen since Sept 2017 that she would need to be seen by a provider first. Pt verbalized understanding and was connected to scheduling.

## 2016-11-26 ENCOUNTER — Ambulatory Visit: Payer: BLUE CROSS/BLUE SHIELD | Admitting: Internal Medicine

## 2016-12-10 NOTE — Progress Notes (Signed)
Cardiology Office Note   Date:  12/13/2016   ID:  Lorraine MayotteKristen E Hobbins, DOB 1990/10/23, MRN [id number]  PCP:  Assunta FoundGolding, John, MD  Cardiologist:   Charlton HawsPeter Jameison Haji, MD   No chief complaint on file.     History of Present Illness: Lorraine Allen is a 26 y.o. female who presents for consultation regarding MVP Referred by Dr Phillips OdorGolding.  She has some issues with chronic nightmares and carries diagnosis of HTN Anxiety and Mitral click murmur syndrome.  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 Has ADD and on adderal lfor more than 2 years. Feels palpitations and has borderline BP. Indicates going to school at night / online wants to try to be a surgical tech. No chest pain. No echo on file. No syncope. Social alcohol no excess. Some anxiety and fatigue with exertional dyspnea Denies other drugs. Concerned about coming off adderall Says she tried Theatre managertrattera when she was younger   Past Medical History:  Diagnosis Date  . Anxiety   . Hypertension   . Mitral click-murmur syndrome     No past surgical history on file.   Current Outpatient Prescriptions  Medication Sig Dispense Refill  . amphetamine-dextroamphetamine (ADDERALL XR) 15 MG 24 hr capsule Take 1 capsule by mouth daily.     No current facility-administered medications for this visit.     Allergies:   Strawberry extract    Social History:  The patient  reports that she has been smoking Cigarettes.  She started smoking about 13 years ago. She has a 2.00 pack-year smoking history. She has never used smokeless tobacco. She reports that she drinks alcohol. She reports that she does not use drugs.   Family History:  The patient's family history includes Cancer in her other; Diabetes in her paternal grandmother; Hypertension in her father and maternal grandfather; Stroke in her father, other, and paternal grandfather; Thyroid disease in her paternal grandmother.    ROS:  Please see the  history of present illness.   Otherwise, review of systems are positive for none.   All other systems are reviewed and negative.    PHYSICAL EXAM: VS:  BP (!) 144/100   Pulse 90   Ht 5\' 5"  (1.651 m)   Wt 135 lb 12.8 oz (61.6 kg)   SpO2 99%   BMI 22.60 kg/m  , BMI Body mass index is 22.6 kg/m. Affect appropriate Healthy:  appears stated age HEENT: normal Neck supple with no adenopathy JVP normal no bruits no thyromegaly Lungs clear with no wheezing and good diaphragmatic motion Heart:  S1/S2 no murmur, no rub, gallop or click PMI normal Abdomen: benighn, BS positve, no tenderness, no AAA no bruit.  No HSM or HJR Distal pulses intact with no bruits No edema Neuro non-focal Skin warm and dry No muscular weakness    EKG:  ST rate 123 ICRBBB  Bi-atrial enlargement    Recent Labs: No results found for requested labs within last 8760 hours.    Lipid Panel No results found for: CHOL, TRIG, HDL, CHOLHDL, VLDL, LDLCALC, LDLDIRECT    Wt Readings from Last 3 Encounters:  12/13/16 135 lb 12.8 oz (61.6 kg)  02/03/16 138 lb 6.4 oz (62.8 kg)  12/10/15 138 lb (62.6 kg)      Other studies Reviewed: Additional studies/ records that were reviewed today include: Notes primary and lab work .    ASSESSMENT AND PLAN:  1.  Murmur/MVP  I hear no murmur on exam doubt MVP echo ordered  2. Anxiety suspect this has something to due with her palpitations and symptoms 3. HTN borderline lifestyle modifications low sodium diet  4. ADD: would stop Adderall and try non stimulant med. Suspect HR/BP issues related to this Primary has checked TSH And normal    Current medicines are reviewed at length with the patient today.  The patient does not have concerns regarding medicines.  The following changes have been made:  no change  Labs/ tests ordered today include: Echo   Orders Placed This Encounter  Procedures  . ECHOCARDIOGRAM COMPLETE     Disposition:   FU with cardiology PRN if  echo normal      Signed, Charlton HawsPeter Kaydin Labo, MD  12/13/2016 5:19 PM    [facility] [address], [place], [place]  [address] Phone: [phone]; Fax: [phone]

## 2016-12-13 ENCOUNTER — Encounter: Payer: Self-pay | Admitting: *Deleted

## 2016-12-13 ENCOUNTER — Encounter: Payer: Self-pay | Admitting: Cardiovascular Disease

## 2016-12-13 ENCOUNTER — Ambulatory Visit (INDEPENDENT_AMBULATORY_CARE_PROVIDER_SITE_OTHER): Payer: BLUE CROSS/BLUE SHIELD | Admitting: Cardiovascular Disease

## 2016-12-13 VITALS — BP 144/100 | HR 90 | Ht 65.0 in | Wt 135.8 lb

## 2016-12-13 DIAGNOSIS — I341 Nonrheumatic mitral (valve) prolapse: Secondary | ICD-10-CM | POA: Diagnosis not present

## 2016-12-13 DIAGNOSIS — I10 Essential (primary) hypertension: Secondary | ICD-10-CM | POA: Diagnosis not present

## 2016-12-13 NOTE — Patient Instructions (Signed)
Medication Instructions:   Your physician recommends that you continue on your current medications as directed. Please refer to the Current Medication list given to you today.  Labwork:  NONE  Testing/Procedures: Your physician has requested that you have an echocardiogram. Echocardiography is a painless test that uses sound waves to create images of your heart. It provides your doctor with information about the size and shape of your heart and how well your heart's chambers and valves are working. This procedure takes approximately one hour. There are no restrictions for this procedure.  Follow-Up:  Your physician recommends that you schedule a follow-up appointment in: as needed.  Any Other Special Instructions Will Be Listed Below (If Applicable).  If you need a refill on your cardiac medications before your next appointment, please call your pharmacy. 

## 2016-12-20 ENCOUNTER — Ambulatory Visit (HOSPITAL_COMMUNITY)
Admission: RE | Admit: 2016-12-20 | Discharge: 2016-12-20 | Disposition: A | Discharge status: Home / Self Care | Payer: BLUE CROSS/BLUE SHIELD | Source: Ambulatory Visit | Attending: Cardiovascular Disease | Admitting: Cardiovascular Disease

## 2016-12-20 DIAGNOSIS — I341 Nonrheumatic mitral (valve) prolapse: Secondary | ICD-10-CM | POA: Diagnosis present

## 2016-12-20 LAB — ECHOCARDIOGRAM COMPLETE
AVLVOTPG: 2 mmHg
CHL CUP DOP CALC LVOT VTI: 15.2 cm
CHL CUP MV DEC (S): 222
EERAT: 6.07
EWDT: 222 ms
FS: 33 % (ref 28–44)
IVS/LV PW RATIO, ED: 1.21
LA diam end sys: 22 mm
LA vol A4C: 25.3 ml
LA vol index: 18.4 mL/m2
LA vol: 30.9 mL
LADIAMINDEX: 1.31 cm/m2
LASIZE: 22 mm
LV E/e' medial: 6.07
LV PW d: 6.59 mm — AB (ref 0.6–1.1)
LV SIMPSON'S DISK: 60
LV TDI E'LATERAL: 12.3
LV dias vol index: 36 mL/m2
LVDIAVOL: 60 mL (ref 46–106)
LVEEAVG: 6.07
LVELAT: 12.3 cm/s
LVOT area: 2.54 cm2
LVOT peak vel: 77.8 cm/s
LVOTD: 18 mm
LVOTSV: 39 mL
LVSYSVOL: 24 mL (ref 14–42)
LVSYSVOLIN: 14 mL/m2
MV Peak grad: 2 mmHg
MVPKAVEL: 42.2 m/s
MVPKEVEL: 74.7 m/s
RV LATERAL S' VELOCITY: 9.68 cm/s
RV TAPSE: 15.7 mm
Stroke v: 36 ml
TDI e' medial: 10

## 2016-12-20 NOTE — Progress Notes (Signed)
*  PRELIMINARY RESULTS* Echocardiogram 2D Echocardiogram has been performed.  Hartley Urton J 12/20/2016, 2:02 PM 

## 2018-03-10 ENCOUNTER — Other Ambulatory Visit (HOSPITAL_COMMUNITY): Payer: Self-pay | Admitting: *Deleted

## 2018-03-10 DIAGNOSIS — N644 Mastodynia: Secondary | ICD-10-CM

## 2018-03-10 DIAGNOSIS — N6452 Nipple discharge: Secondary | ICD-10-CM

## 2018-03-14 ENCOUNTER — Ambulatory Visit (HOSPITAL_COMMUNITY)
Admission: RE | Admit: 2018-03-14 | Discharge: 2018-03-14 | Disposition: A | Discharge status: Home / Self Care | Payer: PRIVATE HEALTH INSURANCE | Source: Ambulatory Visit | Attending: *Deleted | Admitting: *Deleted

## 2018-03-14 DIAGNOSIS — N6452 Nipple discharge: Secondary | ICD-10-CM | POA: Insufficient documentation

## 2018-03-14 DIAGNOSIS — N644 Mastodynia: Secondary | ICD-10-CM | POA: Diagnosis not present

## 2019-01-24 ENCOUNTER — Other Ambulatory Visit: Payer: Self-pay

## 2019-01-24 DIAGNOSIS — Z20822 Contact with and (suspected) exposure to covid-19: Secondary | ICD-10-CM

## 2019-01-25 LAB — NOVEL CORONAVIRUS, NAA: SARS-CoV-2, NAA: NOT DETECTED

## 2019-04-03 ENCOUNTER — Other Ambulatory Visit: Payer: Self-pay

## 2019-04-03 DIAGNOSIS — Z20822 Contact with and (suspected) exposure to covid-19: Secondary | ICD-10-CM

## 2019-04-04 LAB — NOVEL CORONAVIRUS, NAA: SARS-CoV-2, NAA: NOT DETECTED

## 2019-05-02 ENCOUNTER — Ambulatory Visit: Payer: HRSA Program | Attending: Family Medicine

## 2019-05-02 ENCOUNTER — Other Ambulatory Visit: Payer: Self-pay

## 2019-05-02 DIAGNOSIS — Z20828 Contact with and (suspected) exposure to other viral communicable diseases: Secondary | ICD-10-CM | POA: Insufficient documentation

## 2019-05-02 DIAGNOSIS — Z20822 Contact with and (suspected) exposure to covid-19: Secondary | ICD-10-CM

## 2019-05-03 LAB — NOVEL CORONAVIRUS, NAA: SARS-CoV-2, NAA: NOT DETECTED

## 2019-05-24 DIAGNOSIS — Z6821 Body mass index (BMI) 21.0-21.9, adult: Secondary | ICD-10-CM | POA: Diagnosis not present

## 2019-05-24 DIAGNOSIS — Z1389 Encounter for screening for other disorder: Secondary | ICD-10-CM | POA: Diagnosis not present

## 2019-05-24 DIAGNOSIS — E7849 Other hyperlipidemia: Secondary | ICD-10-CM | POA: Diagnosis not present

## 2019-05-24 DIAGNOSIS — F419 Anxiety disorder, unspecified: Secondary | ICD-10-CM | POA: Diagnosis not present

## 2019-05-24 DIAGNOSIS — R7309 Other abnormal glucose: Secondary | ICD-10-CM | POA: Diagnosis not present

## 2019-07-27 ENCOUNTER — Ambulatory Visit: Payer: PRIVATE HEALTH INSURANCE | Attending: Internal Medicine

## 2019-07-27 ENCOUNTER — Other Ambulatory Visit: Payer: Self-pay

## 2019-07-27 DIAGNOSIS — Z20822 Contact with and (suspected) exposure to covid-19: Secondary | ICD-10-CM

## 2019-07-28 LAB — NOVEL CORONAVIRUS, NAA: SARS-CoV-2, NAA: NOT DETECTED

## 2020-01-11 DIAGNOSIS — Z01419 Encounter for gynecological examination (general) (routine) without abnormal findings: Secondary | ICD-10-CM | POA: Diagnosis not present

## 2020-01-11 DIAGNOSIS — Z6823 Body mass index (BMI) 23.0-23.9, adult: Secondary | ICD-10-CM | POA: Diagnosis not present

## 2020-01-25 DIAGNOSIS — R102 Pelvic and perineal pain: Secondary | ICD-10-CM | POA: Diagnosis not present

## 2020-01-25 DIAGNOSIS — Z131 Encounter for screening for diabetes mellitus: Secondary | ICD-10-CM | POA: Diagnosis not present

## 2020-01-25 DIAGNOSIS — Z13220 Encounter for screening for lipoid disorders: Secondary | ICD-10-CM | POA: Diagnosis not present

## 2020-01-25 DIAGNOSIS — Z1329 Encounter for screening for other suspected endocrine disorder: Secondary | ICD-10-CM | POA: Diagnosis not present

## 2020-02-15 ENCOUNTER — Other Ambulatory Visit: Payer: PRIVATE HEALTH INSURANCE

## 2020-02-15 ENCOUNTER — Other Ambulatory Visit: Payer: Self-pay

## 2020-02-15 DIAGNOSIS — Z20822 Contact with and (suspected) exposure to covid-19: Secondary | ICD-10-CM

## 2020-02-17 LAB — NOVEL CORONAVIRUS, NAA: SARS-CoV-2, NAA: NOT DETECTED

## 2020-02-17 LAB — SARS-COV-2, NAA 2 DAY TAT

## 2020-02-18 ENCOUNTER — Telehealth: Payer: Self-pay

## 2020-02-18 NOTE — Telephone Encounter (Signed)
Negative COVID results given. Patient results "NOT Detected." Caller expressed understanding. ° °

## 2020-03-12 ENCOUNTER — Other Ambulatory Visit: Payer: PRIVATE HEALTH INSURANCE

## 2020-03-12 ENCOUNTER — Other Ambulatory Visit: Payer: Self-pay | Admitting: Internal Medicine

## 2020-03-12 DIAGNOSIS — Z20822 Contact with and (suspected) exposure to covid-19: Secondary | ICD-10-CM

## 2020-03-13 LAB — SPECIMEN STATUS REPORT

## 2020-03-13 LAB — SARS-COV-2, NAA 2 DAY TAT

## 2020-03-13 LAB — NOVEL CORONAVIRUS, NAA: SARS-CoV-2, NAA: NOT DETECTED

## 2020-04-24 ENCOUNTER — Other Ambulatory Visit: Payer: Self-pay

## 2020-04-24 ENCOUNTER — Other Ambulatory Visit: Payer: PRIVATE HEALTH INSURANCE

## 2020-04-24 DIAGNOSIS — Z20822 Contact with and (suspected) exposure to covid-19: Secondary | ICD-10-CM

## 2020-04-25 LAB — SARS-COV-2, NAA 2 DAY TAT

## 2020-04-25 LAB — SPECIMEN STATUS REPORT

## 2020-04-25 LAB — NOVEL CORONAVIRUS, NAA: SARS-CoV-2, NAA: NOT DETECTED

## 2020-11-27 DIAGNOSIS — Z20822 Contact with and (suspected) exposure to covid-19: Secondary | ICD-10-CM | POA: Diagnosis not present

## 2021-01-21 DIAGNOSIS — Z0000 Encounter for general adult medical examination without abnormal findings: Secondary | ICD-10-CM | POA: Diagnosis not present

## 2021-01-21 DIAGNOSIS — Z0001 Encounter for general adult medical examination with abnormal findings: Secondary | ICD-10-CM | POA: Diagnosis not present

## 2021-01-21 DIAGNOSIS — Z1389 Encounter for screening for other disorder: Secondary | ICD-10-CM | POA: Diagnosis not present

## 2021-01-21 DIAGNOSIS — Z1331 Encounter for screening for depression: Secondary | ICD-10-CM | POA: Diagnosis not present

## 2021-01-21 DIAGNOSIS — Z6824 Body mass index (BMI) 24.0-24.9, adult: Secondary | ICD-10-CM | POA: Diagnosis not present

## 2021-01-21 DIAGNOSIS — E782 Mixed hyperlipidemia: Secondary | ICD-10-CM | POA: Diagnosis not present

## 2021-01-21 DIAGNOSIS — A881 Epidemic vertigo: Secondary | ICD-10-CM | POA: Diagnosis not present

## 2021-07-03 DIAGNOSIS — Z20822 Contact with and (suspected) exposure to covid-19: Secondary | ICD-10-CM | POA: Diagnosis not present

## 2021-09-21 DIAGNOSIS — N938 Other specified abnormal uterine and vaginal bleeding: Secondary | ICD-10-CM | POA: Diagnosis not present

## 2021-09-21 DIAGNOSIS — Z6824 Body mass index (BMI) 24.0-24.9, adult: Secondary | ICD-10-CM | POA: Diagnosis not present

## 2021-09-21 DIAGNOSIS — N920 Excessive and frequent menstruation with regular cycle: Secondary | ICD-10-CM | POA: Diagnosis not present

## 2021-09-21 DIAGNOSIS — Z01419 Encounter for gynecological examination (general) (routine) without abnormal findings: Secondary | ICD-10-CM | POA: Diagnosis not present

## 2021-09-28 DIAGNOSIS — Z6824 Body mass index (BMI) 24.0-24.9, adult: Secondary | ICD-10-CM | POA: Diagnosis not present

## 2021-09-28 DIAGNOSIS — N938 Other specified abnormal uterine and vaginal bleeding: Secondary | ICD-10-CM | POA: Diagnosis not present

## 2021-10-30 DIAGNOSIS — N920 Excessive and frequent menstruation with regular cycle: Secondary | ICD-10-CM | POA: Diagnosis not present

## 2021-10-30 DIAGNOSIS — Z6824 Body mass index (BMI) 24.0-24.9, adult: Secondary | ICD-10-CM | POA: Diagnosis not present

## 2021-10-30 DIAGNOSIS — F17290 Nicotine dependence, other tobacco product, uncomplicated: Secondary | ICD-10-CM | POA: Diagnosis not present

## 2021-10-30 DIAGNOSIS — I10 Essential (primary) hypertension: Secondary | ICD-10-CM | POA: Diagnosis not present

## 2021-10-30 DIAGNOSIS — F419 Anxiety disorder, unspecified: Secondary | ICD-10-CM | POA: Diagnosis not present

## 2021-10-30 DIAGNOSIS — F988 Other specified behavioral and emotional disorders with onset usually occurring in childhood and adolescence: Secondary | ICD-10-CM | POA: Diagnosis not present

## 2021-10-30 DIAGNOSIS — Z01818 Encounter for other preprocedural examination: Secondary | ICD-10-CM | POA: Diagnosis not present

## 2021-10-30 DIAGNOSIS — N858 Other specified noninflammatory disorders of uterus: Secondary | ICD-10-CM | POA: Diagnosis not present

## 2021-11-02 DIAGNOSIS — N920 Excessive and frequent menstruation with regular cycle: Secondary | ICD-10-CM | POA: Diagnosis not present

## 2021-11-02 DIAGNOSIS — Z01818 Encounter for other preprocedural examination: Secondary | ICD-10-CM | POA: Diagnosis not present

## 2021-11-02 DIAGNOSIS — F419 Anxiety disorder, unspecified: Secondary | ICD-10-CM | POA: Diagnosis not present

## 2021-11-02 DIAGNOSIS — F988 Other specified behavioral and emotional disorders with onset usually occurring in childhood and adolescence: Secondary | ICD-10-CM | POA: Diagnosis not present

## 2021-11-02 DIAGNOSIS — F17290 Nicotine dependence, other tobacco product, uncomplicated: Secondary | ICD-10-CM | POA: Diagnosis not present

## 2021-11-02 DIAGNOSIS — I10 Essential (primary) hypertension: Secondary | ICD-10-CM | POA: Diagnosis not present

## 2021-11-02 DIAGNOSIS — N858 Other specified noninflammatory disorders of uterus: Secondary | ICD-10-CM | POA: Diagnosis not present

## 2021-11-18 DIAGNOSIS — Z0001 Encounter for general adult medical examination with abnormal findings: Secondary | ICD-10-CM | POA: Diagnosis not present

## 2021-11-18 DIAGNOSIS — I10 Essential (primary) hypertension: Secondary | ICD-10-CM | POA: Diagnosis not present

## 2021-11-18 DIAGNOSIS — Z9229 Personal history of other drug therapy: Secondary | ICD-10-CM | POA: Diagnosis not present

## 2021-11-18 DIAGNOSIS — Z6822 Body mass index (BMI) 22.0-22.9, adult: Secondary | ICD-10-CM | POA: Diagnosis not present

## 2021-11-23 DIAGNOSIS — I10 Essential (primary) hypertension: Secondary | ICD-10-CM | POA: Diagnosis not present

## 2021-11-23 DIAGNOSIS — Z9229 Personal history of other drug therapy: Secondary | ICD-10-CM | POA: Diagnosis not present

## 2021-11-23 DIAGNOSIS — Z0001 Encounter for general adult medical examination with abnormal findings: Secondary | ICD-10-CM | POA: Diagnosis not present

## 2021-12-01 ENCOUNTER — Other Ambulatory Visit: Payer: Self-pay | Admitting: Internal Medicine

## 2021-12-01 ENCOUNTER — Other Ambulatory Visit (HOSPITAL_COMMUNITY): Payer: Self-pay | Admitting: Internal Medicine

## 2021-12-01 DIAGNOSIS — I10 Essential (primary) hypertension: Secondary | ICD-10-CM

## 2021-12-29 ENCOUNTER — Ambulatory Visit (HOSPITAL_COMMUNITY)
Admission: RE | Admit: 2021-12-29 | Discharge: 2021-12-29 | Disposition: A | Discharge status: Home / Self Care | Payer: BC Managed Care – PPO | Source: Ambulatory Visit | Attending: Internal Medicine | Admitting: Internal Medicine

## 2021-12-29 DIAGNOSIS — I10 Essential (primary) hypertension: Secondary | ICD-10-CM | POA: Insufficient documentation

## 2021-12-29 LAB — POCT I-STAT CREATININE: Creatinine, Ser: 0.5 mg/dL (ref 0.44–1.00)

## 2021-12-29 MED ORDER — IOHEXOL 350 MG/ML SOLN
80.0000 mL | Freq: Once | INTRAVENOUS | Status: AC | PRN
  Administered 2021-12-29: 80 mL via INTRAVENOUS

## 2022-02-03 DIAGNOSIS — I10 Essential (primary) hypertension: Secondary | ICD-10-CM | POA: Diagnosis not present

## 2022-02-03 DIAGNOSIS — E261 Secondary hyperaldosteronism: Secondary | ICD-10-CM | POA: Diagnosis not present

## 2022-02-03 DIAGNOSIS — D518 Other vitamin B12 deficiency anemias: Secondary | ICD-10-CM | POA: Diagnosis not present

## 2022-02-03 DIAGNOSIS — Z0001 Encounter for general adult medical examination with abnormal findings: Secondary | ICD-10-CM | POA: Diagnosis not present

## 2022-02-03 DIAGNOSIS — E039 Hypothyroidism, unspecified: Secondary | ICD-10-CM | POA: Diagnosis not present

## 2022-02-03 DIAGNOSIS — E559 Vitamin D deficiency, unspecified: Secondary | ICD-10-CM | POA: Diagnosis not present

## 2022-02-05 DIAGNOSIS — Z0001 Encounter for general adult medical examination with abnormal findings: Secondary | ICD-10-CM | POA: Diagnosis not present

## 2022-02-05 DIAGNOSIS — I10 Essential (primary) hypertension: Secondary | ICD-10-CM | POA: Diagnosis not present

## 2022-02-05 DIAGNOSIS — Z6823 Body mass index (BMI) 23.0-23.9, adult: Secondary | ICD-10-CM | POA: Diagnosis not present

## 2022-02-05 DIAGNOSIS — Z9229 Personal history of other drug therapy: Secondary | ICD-10-CM | POA: Diagnosis not present

## 2022-02-05 DIAGNOSIS — F411 Generalized anxiety disorder: Secondary | ICD-10-CM | POA: Diagnosis not present

## 2022-05-11 DIAGNOSIS — J029 Acute pharyngitis, unspecified: Secondary | ICD-10-CM | POA: Diagnosis not present

## 2022-05-11 DIAGNOSIS — U071 COVID-19: Secondary | ICD-10-CM | POA: Diagnosis not present

## 2022-06-15 DIAGNOSIS — F419 Anxiety disorder, unspecified: Secondary | ICD-10-CM | POA: Diagnosis not present

## 2022-06-15 DIAGNOSIS — B354 Tinea corporis: Secondary | ICD-10-CM | POA: Diagnosis not present

## 2022-06-15 DIAGNOSIS — Z6823 Body mass index (BMI) 23.0-23.9, adult: Secondary | ICD-10-CM | POA: Diagnosis not present

## 2022-08-30 DIAGNOSIS — B354 Tinea corporis: Secondary | ICD-10-CM | POA: Diagnosis not present

## 2022-08-30 DIAGNOSIS — F411 Generalized anxiety disorder: Secondary | ICD-10-CM | POA: Diagnosis not present

## 2022-08-30 DIAGNOSIS — I10 Essential (primary) hypertension: Secondary | ICD-10-CM | POA: Diagnosis not present

## 2022-08-30 DIAGNOSIS — Z6823 Body mass index (BMI) 23.0-23.9, adult: Secondary | ICD-10-CM | POA: Diagnosis not present

## 2022-08-30 DIAGNOSIS — L03115 Cellulitis of right lower limb: Secondary | ICD-10-CM | POA: Diagnosis not present

## 2022-09-15 DIAGNOSIS — F411 Generalized anxiety disorder: Secondary | ICD-10-CM | POA: Diagnosis not present

## 2022-09-15 DIAGNOSIS — Z6823 Body mass index (BMI) 23.0-23.9, adult: Secondary | ICD-10-CM | POA: Diagnosis not present

## 2022-09-15 DIAGNOSIS — B360 Pityriasis versicolor: Secondary | ICD-10-CM | POA: Diagnosis not present

## 2022-09-15 DIAGNOSIS — L400 Psoriasis vulgaris: Secondary | ICD-10-CM | POA: Diagnosis not present

## 2022-09-15 DIAGNOSIS — F419 Anxiety disorder, unspecified: Secondary | ICD-10-CM | POA: Diagnosis not present

## 2022-09-15 DIAGNOSIS — R030 Elevated blood-pressure reading, without diagnosis of hypertension: Secondary | ICD-10-CM | POA: Diagnosis not present

## 2022-09-15 DIAGNOSIS — I10 Essential (primary) hypertension: Secondary | ICD-10-CM | POA: Diagnosis not present

## 2022-09-15 DIAGNOSIS — K5900 Constipation, unspecified: Secondary | ICD-10-CM | POA: Diagnosis not present

## 2022-12-21 DIAGNOSIS — R030 Elevated blood-pressure reading, without diagnosis of hypertension: Secondary | ICD-10-CM | POA: Diagnosis not present

## 2022-12-21 DIAGNOSIS — Z6823 Body mass index (BMI) 23.0-23.9, adult: Secondary | ICD-10-CM | POA: Diagnosis not present

## 2022-12-21 DIAGNOSIS — I10 Essential (primary) hypertension: Secondary | ICD-10-CM | POA: Diagnosis not present

## 2023-01-04 DIAGNOSIS — Z20828 Contact with and (suspected) exposure to other viral communicable diseases: Secondary | ICD-10-CM | POA: Diagnosis not present

## 2023-01-04 DIAGNOSIS — A084 Viral intestinal infection, unspecified: Secondary | ICD-10-CM | POA: Diagnosis not present

## 2023-01-04 DIAGNOSIS — Z6823 Body mass index (BMI) 23.0-23.9, adult: Secondary | ICD-10-CM | POA: Diagnosis not present

## 2023-01-04 DIAGNOSIS — R6889 Other general symptoms and signs: Secondary | ICD-10-CM | POA: Diagnosis not present

## 2023-02-22 DIAGNOSIS — Z6823 Body mass index (BMI) 23.0-23.9, adult: Secondary | ICD-10-CM | POA: Diagnosis not present

## 2023-02-22 DIAGNOSIS — I951 Orthostatic hypotension: Secondary | ICD-10-CM | POA: Diagnosis not present
